# Patient Record
Sex: Female | Born: 1935 | Race: White | Hispanic: No | Smoking: Never smoker
Health system: Southern US, Community
[De-identification: ages and names within clinical notes are randomized; demographics above are authoritative.]

## PROBLEM LIST (undated history)

## (undated) DIAGNOSIS — E8881 Metabolic syndrome: Secondary | ICD-10-CM

## (undated) DIAGNOSIS — M179 Osteoarthritis of knee, unspecified: Secondary | ICD-10-CM

## (undated) DIAGNOSIS — E039 Hypothyroidism, unspecified: Secondary | ICD-10-CM

## (undated) DIAGNOSIS — K219 Gastro-esophageal reflux disease without esophagitis: Secondary | ICD-10-CM

## (undated) DIAGNOSIS — E785 Hyperlipidemia, unspecified: Secondary | ICD-10-CM

## (undated) DIAGNOSIS — F32A Depression, unspecified: Secondary | ICD-10-CM

## (undated) DIAGNOSIS — F419 Anxiety disorder, unspecified: Secondary | ICD-10-CM

## (undated) DIAGNOSIS — D510 Vitamin B12 deficiency anemia due to intrinsic factor deficiency: Secondary | ICD-10-CM

## (undated) DIAGNOSIS — K279 Peptic ulcer, site unspecified, unspecified as acute or chronic, without hemorrhage or perforation: Secondary | ICD-10-CM

## (undated) DIAGNOSIS — M1710 Unilateral primary osteoarthritis, unspecified knee: Secondary | ICD-10-CM

## (undated) DIAGNOSIS — Z471 Aftercare following joint replacement surgery: Secondary | ICD-10-CM

## (undated) DIAGNOSIS — I5030 Unspecified diastolic (congestive) heart failure: Secondary | ICD-10-CM

## (undated) DIAGNOSIS — I10 Essential (primary) hypertension: Secondary | ICD-10-CM

## (undated) DIAGNOSIS — F329 Major depressive disorder, single episode, unspecified: Secondary | ICD-10-CM

## (undated) HISTORY — DX: Peptic ulcer, site unspecified, unspecified as acute or chronic, without hemorrhage or perforation: K27.9

## (undated) HISTORY — DX: Unspecified diastolic (congestive) heart failure: I50.30

## (undated) HISTORY — DX: Hypothyroidism, unspecified: E03.9

## (undated) HISTORY — DX: Metabolic syndrome: E88.810

## (undated) HISTORY — DX: Vitamin B12 deficiency anemia due to intrinsic factor deficiency: D51.0

## (undated) HISTORY — PX: TONSILLECTOMY: SUR1361

## (undated) HISTORY — DX: Metabolic syndrome: E88.81

## (undated) HISTORY — DX: Hyperlipidemia, unspecified: E78.5

## (undated) HISTORY — DX: Unilateral primary osteoarthritis, unspecified knee: M17.10

## (undated) HISTORY — DX: Depression, unspecified: F32.A

## (undated) HISTORY — DX: Anxiety disorder, unspecified: F41.9

## (undated) HISTORY — PX: TUBAL LIGATION: SHX77

## (undated) HISTORY — DX: Osteoarthritis of knee, unspecified: M17.9

## (undated) HISTORY — DX: Aftercare following joint replacement surgery: Z47.1

## (undated) HISTORY — DX: Major depressive disorder, single episode, unspecified: F32.9

## (undated) HISTORY — PX: OTHER SURGICAL HISTORY: SHX169

## (undated) HISTORY — DX: Gastro-esophageal reflux disease without esophagitis: K21.9

## (undated) HISTORY — DX: Essential (primary) hypertension: I10

---

## 1975-01-04 ENCOUNTER — Encounter: Payer: Self-pay | Admitting: Cardiology

## 2008-07-25 ENCOUNTER — Ambulatory Visit: Payer: Self-pay | Admitting: Family Medicine

## 2008-07-25 DIAGNOSIS — F329 Major depressive disorder, single episode, unspecified: Secondary | ICD-10-CM

## 2008-07-25 DIAGNOSIS — R0602 Shortness of breath: Secondary | ICD-10-CM

## 2008-07-25 DIAGNOSIS — F411 Generalized anxiety disorder: Secondary | ICD-10-CM

## 2008-07-25 DIAGNOSIS — R05 Cough: Secondary | ICD-10-CM

## 2008-07-25 LAB — CONVERTED CEMR LAB
Eosinophils Relative: 0 % (ref 0–5)
HCT: 43.7 % (ref 36.0–46.0)
Lymphocytes Relative: 26 % (ref 12–46)
Lymphs Abs: 2.1 10*3/uL (ref 0.7–4.0)
Platelets: 269 10*3/uL (ref 150–400)
WBC: 8 10*3/uL (ref 4.0–10.5)

## 2008-08-02 ENCOUNTER — Ambulatory Visit: Payer: Self-pay | Admitting: Family Medicine

## 2008-08-02 DIAGNOSIS — I10 Essential (primary) hypertension: Secondary | ICD-10-CM

## 2008-08-02 DIAGNOSIS — E039 Hypothyroidism, unspecified: Secondary | ICD-10-CM | POA: Insufficient documentation

## 2008-08-02 DIAGNOSIS — M6281 Muscle weakness (generalized): Secondary | ICD-10-CM

## 2008-08-13 ENCOUNTER — Encounter: Payer: Self-pay | Admitting: Family Medicine

## 2008-09-10 ENCOUNTER — Telehealth (INDEPENDENT_AMBULATORY_CARE_PROVIDER_SITE_OTHER): Payer: Self-pay | Admitting: *Deleted

## 2008-09-11 ENCOUNTER — Encounter: Payer: Self-pay | Admitting: Family Medicine

## 2008-10-16 ENCOUNTER — Encounter: Payer: Self-pay | Admitting: Family Medicine

## 2008-10-18 ENCOUNTER — Ambulatory Visit: Payer: Self-pay | Admitting: Family Medicine

## 2008-10-18 DIAGNOSIS — M545 Low back pain: Secondary | ICD-10-CM

## 2008-10-23 ENCOUNTER — Encounter: Payer: Self-pay | Admitting: Family Medicine

## 2008-12-10 ENCOUNTER — Encounter: Payer: Self-pay | Admitting: Family Medicine

## 2009-01-13 ENCOUNTER — Ambulatory Visit: Payer: Self-pay | Admitting: Family Medicine

## 2009-01-13 DIAGNOSIS — D510 Vitamin B12 deficiency anemia due to intrinsic factor deficiency: Secondary | ICD-10-CM

## 2009-01-17 ENCOUNTER — Ambulatory Visit: Payer: Self-pay | Admitting: Family Medicine

## 2009-01-17 DIAGNOSIS — R7301 Impaired fasting glucose: Secondary | ICD-10-CM | POA: Insufficient documentation

## 2009-01-21 ENCOUNTER — Telehealth: Payer: Self-pay | Admitting: Family Medicine

## 2009-01-23 ENCOUNTER — Telehealth: Payer: Self-pay | Admitting: Family Medicine

## 2009-02-10 ENCOUNTER — Ambulatory Visit: Payer: Self-pay | Admitting: Family Medicine

## 2009-02-11 ENCOUNTER — Ambulatory Visit: Payer: Self-pay | Admitting: Family Medicine

## 2009-02-13 ENCOUNTER — Encounter: Payer: Self-pay | Admitting: Family Medicine

## 2009-02-14 ENCOUNTER — Encounter: Payer: Self-pay | Admitting: Family Medicine

## 2009-02-17 ENCOUNTER — Encounter: Payer: Self-pay | Admitting: Family Medicine

## 2009-03-11 ENCOUNTER — Encounter: Payer: Self-pay | Admitting: Family Medicine

## 2009-03-19 ENCOUNTER — Telehealth: Payer: Self-pay | Admitting: Family Medicine

## 2009-03-25 ENCOUNTER — Encounter: Payer: Self-pay | Admitting: Family Medicine

## 2009-04-01 ENCOUNTER — Ambulatory Visit: Payer: Self-pay | Admitting: Family Medicine

## 2009-04-01 DIAGNOSIS — R5381 Other malaise: Secondary | ICD-10-CM

## 2009-04-01 DIAGNOSIS — R197 Diarrhea, unspecified: Secondary | ICD-10-CM

## 2009-04-01 DIAGNOSIS — R5383 Other fatigue: Secondary | ICD-10-CM

## 2009-04-01 DIAGNOSIS — M810 Age-related osteoporosis without current pathological fracture: Secondary | ICD-10-CM | POA: Insufficient documentation

## 2009-04-03 ENCOUNTER — Ambulatory Visit: Payer: Self-pay | Admitting: Family Medicine

## 2009-04-16 ENCOUNTER — Encounter: Payer: Self-pay | Admitting: Family Medicine

## 2009-04-16 LAB — CONVERTED CEMR LAB
Albumin: 4.1 g/dL (ref 3.5–5.2)
BUN: 10 mg/dL (ref 6–23)
CO2: 21 meq/L (ref 19–32)
Calcium: 9.3 mg/dL (ref 8.4–10.5)
Chloride: 102 meq/L (ref 96–112)
Glucose, Bld: 108 mg/dL — ABNORMAL HIGH (ref 70–99)
Hemoglobin: 14 g/dL (ref 12.0–15.0)
MCHC: 32.4 g/dL (ref 30.0–36.0)
Potassium: 3.8 meq/L (ref 3.5–5.3)
RBC: 4.96 M/uL (ref 3.87–5.11)
TSH: 0.47 microintl units/mL (ref 0.350–4.500)
Vit D, 25-Hydroxy: 42 ng/mL (ref 30–89)
Vitamin B-12: 1170 pg/mL — ABNORMAL HIGH (ref 211–911)

## 2009-04-17 ENCOUNTER — Encounter: Payer: Self-pay | Admitting: Family Medicine

## 2009-05-14 ENCOUNTER — Encounter: Payer: Self-pay | Admitting: Family Medicine

## 2009-05-21 ENCOUNTER — Telehealth: Payer: Self-pay | Admitting: Family Medicine

## 2009-05-22 ENCOUNTER — Ambulatory Visit: Payer: Self-pay | Admitting: Family Medicine

## 2009-05-22 DIAGNOSIS — R809 Proteinuria, unspecified: Secondary | ICD-10-CM

## 2009-05-22 LAB — CONVERTED CEMR LAB
Bacteria, UA: NONE SEEN
Blood in Urine, dipstick: NEGATIVE
Nitrite: NEGATIVE
RBC / HPF: NONE SEEN (ref <3)
Urobilinogen, UA: 2
pH: 6

## 2009-05-23 ENCOUNTER — Encounter: Payer: Self-pay | Admitting: Family Medicine

## 2009-05-23 ENCOUNTER — Encounter (INDEPENDENT_AMBULATORY_CARE_PROVIDER_SITE_OTHER): Payer: Self-pay | Admitting: *Deleted

## 2009-05-26 LAB — CONVERTED CEMR LAB
ALT: 10 units/L (ref 0–35)
AST: 14 units/L (ref 0–37)
Albumin: 4.1 g/dL (ref 3.5–5.2)
Alkaline Phosphatase: 105 units/L (ref 39–117)
Calcium: 9.5 mg/dL (ref 8.4–10.5)
Chloride: 99 meq/L (ref 96–112)
Creatinine, Ser: 1.05 mg/dL (ref 0.40–1.20)
LDL Cholesterol: 166 mg/dL — ABNORMAL HIGH (ref 0–99)
Potassium: 3.6 meq/L (ref 3.5–5.3)
Pro B Natriuretic peptide (BNP): 22.1 pg/mL (ref 0.0–100.0)
Total CHOL/HDL Ratio: 4.8
Total CK: 42 units/L (ref 7–177)
Troponin I: 0.01 ng/mL (ref <0.06)

## 2009-06-03 ENCOUNTER — Ambulatory Visit: Payer: Self-pay | Admitting: Family Medicine

## 2009-06-18 ENCOUNTER — Encounter: Payer: Self-pay | Admitting: Cardiology

## 2009-06-18 ENCOUNTER — Ambulatory Visit: Payer: Self-pay | Admitting: Cardiology

## 2009-07-03 ENCOUNTER — Telehealth (INDEPENDENT_AMBULATORY_CARE_PROVIDER_SITE_OTHER): Payer: Self-pay | Admitting: *Deleted

## 2009-07-04 ENCOUNTER — Encounter: Payer: Self-pay | Admitting: Family Medicine

## 2009-07-07 ENCOUNTER — Encounter (HOSPITAL_COMMUNITY): Admission: RE | Admit: 2009-07-07 | Discharge: 2009-09-16 | Payer: Self-pay | Admitting: Cardiology

## 2009-07-07 ENCOUNTER — Ambulatory Visit: Payer: Self-pay

## 2009-07-07 ENCOUNTER — Encounter: Payer: Self-pay | Admitting: Cardiology

## 2009-07-07 ENCOUNTER — Ambulatory Visit (HOSPITAL_COMMUNITY): Admission: RE | Admit: 2009-07-07 | Discharge: 2009-07-07 | Payer: Self-pay | Admitting: Cardiology

## 2009-07-07 ENCOUNTER — Ambulatory Visit: Payer: Self-pay | Admitting: Cardiovascular Disease

## 2009-07-08 ENCOUNTER — Encounter: Payer: Self-pay | Admitting: Cardiovascular Disease

## 2009-07-14 ENCOUNTER — Encounter: Payer: Self-pay | Admitting: Family Medicine

## 2009-07-15 ENCOUNTER — Encounter: Payer: Self-pay | Admitting: Family Medicine

## 2009-07-18 ENCOUNTER — Encounter: Payer: Self-pay | Admitting: Family Medicine

## 2009-07-28 ENCOUNTER — Telehealth: Payer: Self-pay | Admitting: Family Medicine

## 2009-07-29 ENCOUNTER — Encounter: Payer: Self-pay | Admitting: Family Medicine

## 2009-08-06 ENCOUNTER — Ambulatory Visit: Payer: Self-pay | Admitting: Family Medicine

## 2009-08-20 ENCOUNTER — Encounter: Payer: Self-pay | Admitting: Family Medicine

## 2010-04-28 ENCOUNTER — Telehealth: Payer: Self-pay | Admitting: Family Medicine

## 2010-04-30 ENCOUNTER — Encounter: Payer: Self-pay | Admitting: Family Medicine

## 2010-05-29 ENCOUNTER — Encounter: Payer: Self-pay | Admitting: Family Medicine

## 2010-06-01 ENCOUNTER — Telehealth: Payer: Self-pay | Admitting: Family Medicine

## 2010-06-01 ENCOUNTER — Ambulatory Visit
Admission: RE | Admit: 2010-06-01 | Discharge: 2010-06-01 | Payer: Self-pay | Source: Home / Self Care | Attending: Family Medicine | Admitting: Family Medicine

## 2010-06-01 ENCOUNTER — Encounter: Payer: Self-pay | Admitting: Family Medicine

## 2010-06-01 DIAGNOSIS — I5189 Other ill-defined heart diseases: Secondary | ICD-10-CM | POA: Insufficient documentation

## 2010-06-02 LAB — CONVERTED CEMR LAB
BUN: 13 mg/dL (ref 6–23)
Basophils Relative: 2 % — ABNORMAL HIGH (ref 0–1)
Creatinine, Ser: 1.1 mg/dL (ref 0.40–1.20)
Eosinophils Absolute: 0 10*3/uL (ref 0.0–0.7)
Hemoglobin: 13.8 g/dL (ref 12.0–15.0)
MCHC: 33.7 g/dL (ref 30.0–36.0)
MCV: 85 fL (ref 78.0–100.0)
Monocytes Absolute: 0.5 10*3/uL (ref 0.1–1.0)
Monocytes Relative: 4 % (ref 3–12)
RBC: 4.81 M/uL (ref 3.87–5.11)

## 2010-06-03 ENCOUNTER — Telehealth: Payer: Self-pay | Admitting: Family Medicine

## 2010-06-11 ENCOUNTER — Telehealth: Payer: Self-pay | Admitting: Family Medicine

## 2010-06-15 ENCOUNTER — Ambulatory Visit: Admit: 2010-06-15 | Payer: Self-pay | Admitting: Family Medicine

## 2010-06-16 NOTE — Miscellaneous (Signed)
Summary: Care Plan/[facility]  Care Plan/[facility]   Imported By: Lanelle Bal 05/29/2009 14:26:27  _____________________________________________________________________  External Attachment:    Type:   Image     Comment:   External Document

## 2010-06-16 NOTE — Miscellaneous (Signed)
Summary: PSN Eval Order/[facility]  PSN Eval Order/[facility]   Imported By: Lanelle Bal 07/16/2009 10:15:39  _____________________________________________________________________  External Attachment:    Type:   Image     Comment:   External Document

## 2010-06-16 NOTE — Miscellaneous (Signed)
Summary: PT Discharge/[facility]  PT Discharge/[facility]   Imported By: Lanelle Bal 08/15/2009 13:56:07  _____________________________________________________________________  External Attachment:    Type:   Image     Comment:   External Document

## 2010-06-16 NOTE — Progress Notes (Signed)
Summary: Nuclear Pre-Procedure  Phone Note Outgoing Call Call back at [facility] Phone [phone]   Call placed by: Stanton Kidney, EMT-P,  July 03, 2009 1:56 PM Call placed to: Patient Action Taken: Phone Call Completed Summary of Call: Reviewed information on Myoview Information Sheet (see scanned document for further details).  Spoke with Patient.    Nuclear Med Background Indications for Stress Test: Evaluation for Ischemia, Abnormal EKG     Symptoms: Chest Pain, Diaphoresis, DOE    Nuclear Pre-Procedure Cardiac Risk Factors: Family History - CAD, Hypertension, Lipids Height (in): 66.5

## 2010-06-16 NOTE — Assessment & Plan Note (Signed)
Summary: BP DROPPED Yesterday   Vital Signs:  Patient profile:   77 year old female Height:      66.5 inches Weight:      186 pounds  Vitals Entered By: Kathlene November (May 22, 2009 11:52 AM)   Primary Care Provider:  Nani Gasser MD   History of Present Illness: Has felt fatigued and then yesterday almost passed out when stood up to get a  bottle of pills.  The home health nurse checked her BP and it was 90/60.  Then repeat to 150/80.  Still having occcassional diarrhea.  off teh nexium felt great for about 10 days and diarrhea improved.  Now on ranitidine.  Stilll having some diarrhea but is better than she was.   Drinks 2 cups of coffee adn 2 cups of water.  1 can of soda a day. Also have atypical chest pain but says it really feels like heartburn. No chest pain right now.   CBC and thryoid were normal 4 weeks ago on her labwork.  No blood in the urine or stool.   Allergies: 1)  ! Prednisone 2)  ! Sulfa  Past History:  Past Medical History: Reviewed history from 02/11/2009 and no changes required. Current Problems:  MUSCLE WEAKNESS (GENERALIZED) (ICD-728.87)n- Uses a walker.  HYPOTHYROIDISM (ICD-244.9) BRONCHITIS, ACUTE (ICD-466.0) HYPERTENSION, BENIGN (ICD-401.1) DYSPNEA (ICD-786.05) COUGH (ICD-786.2) FAMILY HISTORY DIABETES 1ST DEGREE RELATIVE (ICD-V18.0) ANXIETY (ICD-300.00) DEPRESSION (ICD-311) HYPERTENSION (ICD-401.9)  Physical Exam  General:  Well-developed,well-nourished,in no acute distress; alert,appropriate and cooperative throughout examination Head:  Normocephalic and atraumatic without obvious abnormalities. No apparent alopecia or balding. Neck:  No deformities, masses, or tenderness noted. Lungs:  Normal respiratory effort, chest expands symmetrically. Lungs are clear to auscultation, no crackles or wheezes. Heart:  Normal rate and regular rhythm. S1 and S2 normal without gallop, murmur, click, rub or other extra sounds. No carotid bruits.     Pulses:  Radial 2+  Extremities:  Trace bilat LE edema.  Skin:  Skin feels diaphoretic Psych:  Cognition and judgment appear intact. Alert and cooperative with normal attention span and concentration. No apparent delusions, illusions, hallucinations   Impression & Recommendations:  Problem # 1:  CHEST PAIN, ATYPICAL (ICD-786.59) Discussed that her EKG is stable but still need to see cardiology. Also with her "heart skipping" she may need  a stress echo and maybe a holter monitor. Did discuss increaseing her fluid intake. With the diarreha I wonder if she may be a little dehydrated. DId have protein, bili, and LE on urine so will send for micro and urine.  If meantime if CP recurs an is more severe or has a syncopal episode needs to go to the ED.   EKG show rate 98 bm. no PVCs. Poor r wave progresssion.  ST depression in Leads V2, V3.  RAE. Also she is insuling resistant so I wonder if a low sugar or high sugar could also be causing some of her symptoms.   Orders: Cardiology Referral (Cardiology) T-Comprehensive Metabolic Panel [phone]) T-Lipid Profile [phone]) T- * Misc. Laboratory test [phone]) T-BNP  (B Natriuretic Peptide) [phone])  Problem # 2:  PROTEINURIA (ICD-791.0)  Orders: T-Urine Culture (Spectrum Order) [phone]) T-Urine Microscopic [phone]) UA Dipstick w/o Micro (automated)  ([id number])  Complete Medication List: 1)  Cymbalta 30 Mg Cpep (Duloxetine hcl) .Marland Kitchen.. 1 tab by mouth once daily 2)  Ranitidine Hcl 150 Mg Caps (Ranitidine hcl) .... Take 1 tablet by mouth two times a day 3)  Alprazolam 1 Mg  Tabs (Alprazolam) .... 1/2 tab by mouth am and 1/2 tab by mouth pm 4)  Armour Thyroid 90 Mg Tabs (Thyroid) .... Take one tablet by mouth daily 5)  Metoclopramide Hcl 5 Mg Tabs (Metoclopramide hcl) .... Take one tablet by mouth in the morning and evening 6)  Hydrochlorothiazide 25 Mg Tabs (Hydrochlorothiazide) .... Take 1/2 tablet by mouth once a day 7)   Amlodipine Besylate 5 Mg Tabs (Amlodipine besylate) .... Take one tablet by mouth ion the morning 8)  Cyanocobalamin 1000 Mcg/ml Soln (Cyanocobalamin) .... Single dose vial for 1 injection 9)  Triamcinolone Acetonide 0.5 % Crea (Triamcinolone acetonide) .... Mixed 1:1 with aquaphor  Laboratory Results   Urine Tests  Date/Time Received: 05/22/2009 Date/Time Reported: 05/22/2009  Routine Urinalysis   Color: straw Appearance: Clear Glucose: negative   (Normal Range: Negative) Bilirubin: moderate   (Normal Range: Negative) Ketone: small (15)   (Normal Range: Negative) Spec. Gravity: 1.020   (Normal Range: 1.003-1.035) Blood: negative   (Normal Range: Negative) pH: 6.0   (Normal Range: 5.0-8.0) Protein: 100   (Normal Range: Negative) Urobilinogen: 2.0   (Normal Range: 0-1) Nitrite: negative   (Normal Range: Negative) Leukocyte Esterace: trace   (Normal Range: Negative)       Appended Document: Near syncope

## 2010-06-16 NOTE — Progress Notes (Signed)
Summary: BP  Phone Note Other Incoming Call back at [phone]   Caller: Drinda Butts w/ [facility] Summary of Call: Nurse calls and was at patient home doing her annual recertification and when pt stood up got real dizzy so took a sitting BP and it was 150/80 and when stood up BP 90/60 with pulse of 91 and regular Initial call taken by: Kathlene November,  May 21, 2009 1:25 PM  Follow-up for Phone Call        Have her schedule an OV> Any signs of dehydration?  Follow-up by: Nani Gasser MD,  May 21, 2009 1:50 PM  Additional Follow-up for Phone Call Additional follow up Details #1::        Pt notified of MD instructions and sent to scheduling to schedule an OV Additional Follow-up by: Kathlene November,  May 21, 2009 2:09 PM

## 2010-06-16 NOTE — Miscellaneous (Signed)
Summary: Care Plan/[facility]  Care Plan/[facility]   Imported By: Lanelle Bal 06/16/2009 08:01:40  _____________________________________________________________________  External Attachment:    Type:   Image     Comment:   External Document

## 2010-06-16 NOTE — Miscellaneous (Signed)
Summary: PT Orders/[facility]  PT Orders/[facility]   Imported By: Lanelle Bal 09/11/2009 09:03:04  _____________________________________________________________________  External Attachment:    Type:   Image     Comment:   External Document

## 2010-06-16 NOTE — Miscellaneous (Signed)
Summary: Missed Visit/[facility]  Missed Visit/[facility]   Imported By: Lanelle Bal 08/14/2009 09:45:08  _____________________________________________________________________  External Attachment:    Type:   Image     Comment:   External Document

## 2010-06-16 NOTE — Miscellaneous (Signed)
Summary: Care Plan/[facility]  Care Plan/[facility]   Imported By: Lanelle Bal 08/05/2009 09:58:36  _____________________________________________________________________  External Attachment:    Type:   Image     Comment:   External Document

## 2010-06-16 NOTE — Progress Notes (Signed)
Summary: Correct form for recert  Phone Note Outgoing Call   Summary of Call: Called [facility] to have them correct meds on recert for home health and then refax to me.  Initial call taken by: Nani Gasser MD,  July 28, 2009 12:07 PM

## 2010-06-16 NOTE — Miscellaneous (Signed)
Summary: PSN Order/[facility]  PSN Order/[facility]   Imported By: Lanelle Bal 07/11/2009 08:03:50  _____________________________________________________________________  External Attachment:    Type:   Image     Comment:   External Document

## 2010-06-16 NOTE — Letter (Signed)
Summary: Primary Care Consult Scheduled Letter  [place] [facility]  [address]   [place], [place] [address]   Phone: [phone]  Fax: [phone]      05/23/2009 MRN: [id number]  Nicole Duffy [address], [address] [place]  [address]    Dear Ms. Marconi,      We have scheduled an appointment for you.  At the recommendation of Dr.Metheney, we have scheduled you a consult with Dr Jens Som, [facility] , Kathryne Sharper  on January 19th  at [place].  Their address is [address] [place] [address] [place]  . The office phone number is [phone].  If this appointment day and time is not convenient for you, please feel free to call the office of the doctor you are being referred to at the number listed above and reschedule the appointment.     It is important for you to keep your scheduled appointments. We are here to make sure you are given good patient care. If you have questions or you have made changes to your appointment, please notify us at  [phone], ask for [facility].    Thank you,  Patient Care Coordinator [place] [facility]

## 2010-06-16 NOTE — Assessment & Plan Note (Signed)
Summary: [facility]   Visit Type:  Follow-up Primary Provider:  Nani Gasser MD  CC:  Abnormal EKG.  History of Present Illness: 77 year old female for evaluation of chest pain and an abnormal electrocardiogram. The patient has no prior cardiac history. She is somewhat immobile due to 2 knee problems. She walks with a walker. Over the past one to 2 years she has noticed dyspnea on exertion or lead with rest. It is not associated with chest pain. There is no orthopnea or PND but has chronic pedal edema. She also occasionally feels a discomfort in her chest that is difficult to describe. It feels as though "she cannot take a deep breath". It is not pleuritic or positional. It does not radiate. She does have some diaphoresis. He can occur either with exertion or at rest. It improves when she tries to "be quiet". She was seen in early January due to transient hypotension. Her electrocardiogram was felt to be abnormal and cardiology was asked to further evaluate. Note cardiac markers were negative x2. Also note a BNP was normal at that time. Because of her chest pain and abnormal electrocardiogram cardiology was asked to further evaluate.   Current Medications (verified): 1)  Ranitidine Hcl 150 Mg Caps (Ranitidine Hcl) .... Take 1 Tablet By Mouth Two Times A Day 2)  Alprazolam 1 Mg Tabs (Alprazolam) .... 1/2 Tab By Mouth Am and 1/2 Tab By Mouth Pm 3)  Armour Thyroid 90 Mg Tabs (Thyroid) .... Take One Tablet By Mouth Daily 4)  Metoclopramide Hcl 5 Mg Tabs (Metoclopramide Hcl) .... Take One Tablet By Mouth in The Morning and Evening 5)  Hydrochlorothiazide 25 Mg Tabs (Hydrochlorothiazide) .... Take 1/2 Tablet By Mouth Once A Day 6)  Amlodipine Besylate 5 Mg Tabs (Amlodipine Besylate) .... Take One Tablet By Mouth Ion The Morning 7)  Triamcinolone Acetonide 0.5 % Crea (Triamcinolone Acetonide) .... Mixed 1:1 With Aquaphor 8)  Simvastatin 20 Mg Tabs (Simvastatin) .... Take 1 Tablet By Mouth  Once A Day At Bedtime 9)  Cymbalta 30 Mg Cpep (Duloxetine Hcl) .... Take 1 Capsule By Mouth Once A Day 10)  B Complex-B12  Tabs (B Complex Vitamins) .... Take 1 Tablet By Mouth Once A Day 11)  Prolent- Vitamin .... Take 1 Tablet By Mouth Once A Day 12)  Lentra .... Take 1 Tablet By Mouth Once A Day 13)  Leg Cramps .... Take 1 Tablet By Mouth Once A Day  Allergies: 1)  ! Prednisone 2)  ! Sulfa  Past History:  Past Medical History: Current Problems:  HYPERTENSION, BENIGN (ICD-401.1) PROTEINURIA (ICD-791.0) OSTEOPOROSIS (ICD-733.00) SINUSITIS - ACUTE-NOS (ICD-461.9) INSULIN RESISTANCE SYNDROME (ICD-259.8) ANEMIA, PERNICIOUS (ICD-281.0) LUMBAGO (ICD-724.2) HYPOTHYROIDISM (ICD-244.9) ANXIETY (ICD-300.00) DEPRESSION (ICD-311) Hyperlipidemia Remote history of peptic ulcer disease GERD  Past Surgical History: Reviewed history from 07/25/2008 and no changes required. Hysterectomy Tubal ligation Tonsillectomy  Family History: Reviewed history from 08/02/2008 and no changes required. Family History of Colon CA 1st degree relative <60 Family History Ovarian cancer Family History Diabetes 1st degree relative, borther Brother w/ depression, HTN, Hi cholesterol  Brother with CABG at age 32  Social History: Reviewed history from 08/02/2008 and no changes required. Divorced.  Completed community college.  Lives in assited living center.   Never Smoked Alcohol use-no Drug use-no Regular exercise-no  Review of Systems       Chronic weakness and dyspnea on exertion but no fevers or chills, productive cough, hemoptysis, dysphasia, odynophagia, melena, hematochezia, dysuria, hematuria, rash, seizure activity, orthopnea, PND,  claudication. Remaining systems are negative.   Vital Signs:  Patient profile:   77 year old female Height:      66.5 inches Weight:      189.25 pounds BMI:     30.20 Pulse rate:   112 / minute Pulse rhythm:   irregular Resp:     18 per minute BP  sitting:   150 / 70  (left arm) Cuff size:   large  Vitals Entered By: Vikki Ports (June 18, 2009 2:17 PM)  Physical Exam  General:  Well developed/well nourished in NAD Skin warm/dry Patient not depressed No peripheral clubbing Back-normal HEENT-normal/normal eyelids Neck supple/normal carotid upstroke bilaterally; no bruits; no JVD; no thyromegaly chest - CTA/ normal expansion CV - RRR/normal S1 and S2; no murmurs, rubs or gallops;  PMI nondisplaced; 1/6 systolic ejection murmur. Abdomen -NT/ND, no HSM, no mass, + bowel sounds, no bruit 2+ femoral pulses, no bruits Ext-trace edema, chords, 2+ DP Neuro-grossly nonfocal     EKG  Procedure date:  06/18/2009  Findings:      Sinus rhythm rate of 90. Left anterior fascicular block. Cannot rule out prior septal infarct.  Impression & Recommendations:  Problem # 1:  CHEST PAIN, ATYPICAL (ICD-786.59) Symptoms atypical. I will schedule a Lexiscan Myoview for risk stratification. Her updated medication list for this problem includes:    Amlodipine Besylate 5 Mg Tabs (Amlodipine besylate) .Marland Kitchen... Take one tablet by mouth ion the morning  Orders: Nuclear Stress Test (Nuc Stress Test)  Problem # 2:  DYSPNEA (ICD-786.05) Her BNP was normal and she does not appear to be volume overloaded. I wonder if there may be a component of deconditioning related to her inability to exercise and decreased mobility. I will schedule an echocardiogram to quantify LV function. Her murmur sounds to be an ejection murmur. Her updated medication list for this problem includes:    Hydrochlorothiazide 25 Mg Tabs (Hydrochlorothiazide) .Marland Kitchen... Take 1/2 tablet by mouth once a day    Amlodipine Besylate 5 Mg Tabs (Amlodipine besylate) .Marland Kitchen... Take one tablet by mouth ion the morning  Problem # 3:  HYPERTENSION, BENIGN (ICD-401.1)  Blood pressure mildly elevated. This will be followed and her Norvasc can be increased as needed. Her updated medication list  for this problem includes:    Hydrochlorothiazide 25 Mg Tabs (Hydrochlorothiazide) .Marland Kitchen... Take 1/2 tablet by mouth once a day    Amlodipine Besylate 5 Mg Tabs (Amlodipine besylate) .Marland Kitchen... Take one tablet by mouth ion the morning  Orders: Echocardiogram (Echo)  Problem # 4:  HYPOTHYROIDISM (ICD-244.9)  Her updated medication list for this problem includes:    Armour Thyroid 90 Mg Tabs (Thyroid) .Marland Kitchen... Take one tablet by mouth daily  Patient Instructions: 1)  Your physician recommends that you schedule a follow-up appointment in: AS NEEDED PENDING TEST RESULTS 2)  Your physician has requested that you have an LEXISCAN stress myoview.  For further information please visit [email or web address]  Please follow instruction sheet, as given. 3)  Your physician has requested that you have an echocardiogram.  Echocardiography is a painless test that uses sound waves to create images of your heart. It provides your doctor with information about the size and shape of your heart and how well your heart's chambers and valves are working.  This procedure takes approximately one hour. There are no restrictions for this procedure.

## 2010-06-16 NOTE — Miscellaneous (Signed)
Summary: Care Plan/[facility]  Care Plan/[facility]   Imported By: Lanelle Bal 08/12/2009 08:34:08  _____________________________________________________________________  External Attachment:    Type:   Image     Comment:   External Document

## 2010-06-16 NOTE — Assessment & Plan Note (Signed)
Summary: Cardiology Nuclear Study  Nuclear Med Background Indications for Stress Test: Evaluation for Ischemia, Abnormal EKG     Symptoms: Chest Pain, Diaphoresis, DOE    Nuclear Pre-Procedure Cardiac Risk Factors: Family History - CAD, Hypertension, Lipids Caffeine/Decaff Intake: none NPO After: 7:00 PM Lungs: clear IV 0.9% NS with Angio Cath: 20g     IV Site: (R) AC IV Started by: Stanton Kidney EMT-P Chest Size (in) 42     Cup Size D     Height (in): 66.5 Weight (lb): 185 BMI: 29.52  Nuclear Med Study 1 or 2 day study:  1 day     Stress Test Type:  Eugenie Birks Reading MD:  Charlton Haws, MD     Referring MD:  B.Crenshaw Resting Radionuclide:  Technetium 27m Tetrofosmin     Resting Radionuclide Dose:  11.0 mCi  Stress Radionuclide:  Technetium 34m Tetrofosmin     Stress Radionuclide Dose:  33.0 mCi   Stress Protocol   Lexiscan: 0.4 mg   Stress Test Technologist:  Milana Na EMT-P     Nuclear Technologist:  Burna Mortimer Deal RT-N  Rest Procedure  Myocardial perfusion imaging was performed at rest 45 minutes following the intravenous administration of Myoview Technetium 58m Tetrofosmin.  Stress Procedure  The patient received IV Lexiscan 0.4 mg over 15-seconds.  Myoview injected at 30-seconds.  There were no significant changes, + chest tightness, and rare pvcs/fusion beats with infusion.  Quantitative spect images were obtained after a 45 minute delay.  QPS Raw Data Images:  Normal; no motion artifact; normal heart/lung ratio. Stress Images:  NI: Uniform and normal uptake of tracer in all myocardial segments. Rest Images:  Normal homogeneous uptake in all areas of the myocardium. Subtraction (SDS):  Normal Transient Ischemic Dilatation:  n  (Normal <1.22)  Lung/Heart Ratio:  .23  (Normal <0.45)  Quantitative Gated Spect Images QGS EDV:  45 ml QGS ESV:  7 ml QGS EF:  83 % QGS cine images:  normal  Findings Normal nuclear study      Overall Impression  Exercise  Capacity: Lexiscan BP Response: Normal blood pressure response. Clinical Symptoms: chest tightness ECG Impression: No significant ST segment change suggestive of ischemia. Overall Impression: Normal stress nuclear study.  Appended Document: Cardiology Nuclear Study ok  Appended Document: Cardiology Nuclear Study pt aware of results

## 2010-06-18 ENCOUNTER — Encounter: Payer: Self-pay | Admitting: Family Medicine

## 2010-06-18 ENCOUNTER — Ambulatory Visit (INDEPENDENT_AMBULATORY_CARE_PROVIDER_SITE_OTHER): Payer: 59 | Admitting: Family Medicine

## 2010-06-18 DIAGNOSIS — I10 Essential (primary) hypertension: Secondary | ICD-10-CM

## 2010-06-18 DIAGNOSIS — R05 Cough: Secondary | ICD-10-CM

## 2010-06-18 DIAGNOSIS — R059 Cough, unspecified: Secondary | ICD-10-CM

## 2010-06-18 NOTE — Progress Notes (Signed)
Summary: Change med sig  Phone Note From Pharmacy   Summary of Call: for next xanax refill change to 0.5 mg so doesn' thave to cut inhalf. Went ahead and changed sig under the med list.  Initial call taken by: Nani Gasser MD,  June 03, 2010 11:57 AM    New/Updated Medications: ALPRAZOLAM 0.5 MG TABS (ALPRAZOLAM) one by mouth up to three times a day as needed anxiety 60/mo  Appended Document: Change med sig

## 2010-06-18 NOTE — Letter (Signed)
Summary: [facility]  [facility]   Imported By: Lanelle Bal 05/25/2010 12:48:25  _____________________________________________________________________  External Attachment:    Type:   Image     Comment:   External Document

## 2010-06-18 NOTE — Progress Notes (Signed)
Summary: Home Health Orders  Phone Note From Other Clinic Call back at office number [phone]   Caller: Nurse, [facility] Reason for Call: Need Referral Information, Medication Check Summary of Call: Marylene Land did home visit to pt this weekend and had questions regarding her care. Meds verified from pt encounter this AM in office.  Also, nurse would like order for pt to have HomeHealth Aide for the next few weeks as she regains her strength.  Also would like order to add Cardiac Program to her care so they can monitor her BP as she was having orthostatic hypertension issues at the [facility].  Please advise and I can call verbal order to [place] today. [phone]) Initial call taken by: Francee Piccolo CMA Duncan Dull),  June 01, 2010 2:41 PM  Follow-up for Phone Call        Hold off on cardiac program for now. OK for home health aide.  Follow-up by: Nani Gasser MD,  June 01, 2010 2:57 PM  Additional Follow-up for Phone Call Additional follow up Details #1::        mesage left for Marylene Land to return call regarding orders. Francee Piccolo CMA Duncan Dull)  June 01, 2010 3:19 PM  Marylene Land notified of orders. Additional Follow-up by: Francee Piccolo CMA (AAMA),  June 01, 2010 3:21 PM

## 2010-06-18 NOTE — Letter (Signed)
Summary: [facility]  [facility]   Imported By: Lanelle Bal 05/21/2010 12:19:19  _____________________________________________________________________  External Attachment:    Type:   Image     Comment:   External Document

## 2010-06-18 NOTE — Progress Notes (Signed)
Summary: FYI- in hospital  Phone Note Call from Patient   Caller: Son Call For: Nani Gasser MD Summary of Call: Son calls and states she is in [place] hospital syncopal episode and low blood pressure. She is in [facility] hospital.  FYI Initial call taken by: Kathlene November LPN,  April 28, 2010 11:39 AM

## 2010-06-18 NOTE — Assessment & Plan Note (Signed)
Summary: Hospital Followup   Vital Signs:  Patient profile:   77 year old female Height:      66.5 inches Weight:      185 pounds Pulse rate:   78 / minute BP sitting:   153 / 76  (right arm) Cuff size:   large  Vitals Entered By: Avon Gully CMA, Duncan Dull) (June 01, 2010 9:24 AM)  Primary Care Provider:  Nani Gasser MD  CC:  F/u hospital  visit.  History of Present Illness: Pt was hospitalized for a hypotensive episode where she passed out and his her head. Did have a laceration.  She was then d/c to a SNIF.  Still feeling very weak since has been home.  D/C home the skilled nursing last Friday. Some trouble with her memory for the last several weeks.  Says she feels she is having a hardtime understanding some things. .  Still coughing some. OUt of her rx of rher reflux.  Has home health coming in for now at home.  Working on her strength. Would like a refill on her cough meds. Has been taking every 6 hours today. Would like a refill on this. WAsn't given a rx for this at d/c so hasn'ty had anything for 4 nights and the cough is keeping her awake.  She still has 5 more days of Levaquin.  Toelrating it well.  her son is with her today, but she does live alone.  Current Medications (verified): 1)  Alprazolam 1 Mg Tabs (Alprazolam) .... 1/2 Tab By Mouth Am and 1/2 Tab By Mouth Pm 2)  Armour Thyroid 90 Mg Tabs (Thyroid) .... Take One Tablet By Mouth Daily 3)  Metoclopramide Hcl 5 Mg Tabs (Metoclopramide Hcl) .... Take One Tablet By Mouth in The Morning and Evening 4)  Amlodipine Besylate 5 Mg Tabs (Amlodipine Besylate) .... Take One Tablet By Mouth Ion The Morning 5)  Simvastatin 20 Mg Tabs (Simvastatin) .... Take 1 Tablet By Mouth Once A Day At Bedtime 6)  Cymbalta 30 Mg Cpep (Duloxetine Hcl) .... Take 1 Capsule By Mouth Once A Day 7)  B Complex-B12  Tabs (B Complex Vitamins) .... Take 1 Tablet By Mouth Once A Day 8)  Prolent- Vitamin .... Take 1 Tablet By Mouth Once A Day 9)   Lentra .... Take 1 Tablet By Mouth Once A Day 10)  Leg Cramps .... Take 1 Tablet By Mouth Once A Day 11)  Pantoprazole Sodium 20 Mg Tbec (Pantoprazole Sodium)  Allergies (verified): 1)  ! Prednisone 2)  ! Sulfa  Comments:  Nurse/Medical Assistant: The patient's medications and allergies were reviewed with the patient and were updated in the Medication and Allergy Lists. Avon Gully CMA, Duncan Dull) (June 01, 2010 9:27 AM)  Past History:  Past Medical History: Remote history of peptic ulcer disease  Social History: Reviewed history from 08/02/2008 and no changes required. Divorced.  Completed community college.  Lives in assited living center.   Never Smoked Alcohol use-no Drug use-no Regular exercise-no  Physical Exam  General:  Well-developed,well-nourished,in no acute distress; alert,appropriate and cooperative throughout examination Head:  Normocephalic and atraumatic without obvious abnormalities. No apparent alopecia or balding. Eyes:  No corneal or conjunctival inflammation noted. EOMI. Perrla.  Ears:  External ear exam shows no significant lesions or deformities.  Otoscopic examination reveals clear canals, tympanic membranes are intact bilaterally without bulging, retraction, inflammation or discharge. Hearing is grossly normal bilaterally. Nose:  External nasal examination shows no deformity or inflammation.  Mouth:  Oral  mucosa and oropharynx without lesions or exudates. Neck:  No deformities, masses, or tenderness noted. Lungs:  normal respiratory effort.  Caorse BS at the apices bilaterally.  Heart:  Normal rate and regular rhythm. S1 and S2 normal without gallop, murmur, click, rub or other extra sounds. Skin:  no rashes.   Psych:  Cognition and judgment appear intact. Alert and cooperative with normal attention span and concentration. No apparent delusions, illusions, hallucinations   Impression & Recommendations:  Problem # 1:  HYPERTENSION, BENIGN  (ICD-401.1) Elevated today but she has not filled the amlodipine for teh higher dose yet. Get this filled and then f/u in 2 weeks. Will recheck BMP off of the HCTZ.   I did ask her to get a home BP cuff and check it herself once a day to start.  The following medications were removed from the medication list:    Hydrochlorothiazide 25 Mg Tabs (Hydrochlorothiazide) .Marland Kitchen... Take 1/2 tablet by mouth once a day Her updated medication list for this problem includes:    Amlodipine Besylate 10 Mg Tabs (Amlodipine besylate) .Marland Kitchen... Take 1 tablet by mouth once a day  Orders: T-Basic Metabolic Panel [phone]) T-CBC w/Diff [phone])  Problem # 2:  MUSCLE WEAKNESS (GENERALIZED) (ICD-728.87) Expalined that I really want to wean her cough med to bedtime only as the cough med can be sedating since has a  narcotic in it and may affect her feeling of weakness etc. Hopefully home PT will be able to help her. She was also put on Levaquin while in the [facility] but I am not sure why, I suspect for PNA. Asked her to complete her ABX and we will recheck her ulngs in 2 weeks.   Problem # 3:  DIASTOLIC DYSFUNCTION (ICD-429.9) Per rec on her D/C summary will refer her to cardiology for further recommendations or w/u.  Orders: Cardiology Referral (Cardiology)  Complete Medication List: 1)  Alprazolam 1 Mg Tabs (Alprazolam) .... 1/2 tab by mouth am and 1/2 tab by mouth pm 2)  Armour Thyroid 90 Mg Tabs (Thyroid) 3)  Metoclopramide Hcl 5 Mg Tabs (Metoclopramide hcl) .... Take one tablet by mouth in the morning and evening 4)  Amlodipine Besylate 10 Mg Tabs (Amlodipine besylate) .... Take 1 tablet by mouth once a day 5)  Simvastatin 20 Mg Tabs (Simvastatin) .... Take 1 tablet by mouth once a day at bedtime 6)  Cymbalta 30 Mg Cpep (Duloxetine hcl) .... Take 1 capsule by mouth once a day 7)  B Complex-b12 Tabs (B complex vitamins) .... Take 1 tablet by mouth once a day 8)  Prolent- Vitamin  .... Take 1 tablet by mouth  once a day 9)  Lentra  .... Take 1 tablet by mouth once a day 10)  Leg Cramps  .... Take 1 tablet by mouth once a day 11)  Pantoprazole Sodium 20 Mg Tbec (Pantoprazole sodium) .... Take 1 tablet by mouth once a day 12)  Hydrocodone-homatropine 5-1.5 Mg/72ml Syrp (Hydrocodone-homatropine) .... 5ml by mouth at bedtime  Patient Instructions: 1)  Please schedule a follow-up appointment in 2 weeks to recheck your lungs.  2)  we will call you with your lab results.  Prescriptions: PANTOPRAZOLE SODIUM 20 MG TBEC (PANTOPRAZOLE SODIUM) Take 1 tablet by mouth once a day  #90 x 1   Entered and Authorized by:   Nani Gasser MD   Signed by:   Nani Gasser MD on 06/01/2010   Method used:   Printed then faxed to [facility] (mail-order)             , [place]         Ph:        Fax: [phone]   RxID:   [id number] CYMBALTA 30 MG CPEP (DULOXETINE HCL) Take 1 capsule by mouth once a day  #90 x 1   Entered and Authorized by:   Nani Gasser MD   Signed by:   Nani Gasser MD on 06/01/2010   Method used:   Printed then faxed to ...       [facility] (mail-order)             , [place]         Ph:        Fax: [phone]   RxID:   [id number] SIMVASTATIN 20 MG TABS (SIMVASTATIN) Take 1 tablet by mouth once a day at bedtime  #90 x 1   Entered and Authorized by:   Nani Gasser MD   Signed by:   Nani Gasser MD on 06/01/2010   Method used:   Printed then faxed to ...       [facility] (mail-order)             , [place]         Ph:        Fax: [phone]   RxID:   [id number] AMLODIPINE BESYLATE 10 MG TABS (AMLODIPINE BESYLATE) Take 1 tablet by mouth once a day  #90 x 1   Entered and Authorized by:   Nani Gasser MD   Signed by:   Nani Gasser MD on 06/01/2010   Method used:   Printed then faxed to ...       right source (mail-order)             , [place]         Ph:        Fax: [phone]   RxID:   [id number] METOCLOPRAMIDE HCL 5 MG  TABS (METOCLOPRAMIDE HCL) Take one tablet by mouth in the morning and evening  #180 x 1   Entered and Authorized by:   Nani Gasser MD   Signed by:   Nani Gasser MD on 06/01/2010   Method used:   Printed then faxed to ...       [facility] source (mail-order)             , [place]         Ph:        Fax: [phone]   RxID:   [id number] ALPRAZOLAM 1 MG TABS (ALPRAZOLAM) 1/2 tab by mouth am and 1/2 tab by mouth pm  #90 x 1   Entered and Authorized by:   Nani Gasser MD   Signed by:   Nani Gasser MD on 06/01/2010   Method used:   Printed then faxed to ...       [facility] source (mail-order)             , [place]         Ph:        Fax: [phone]   RxID:   [id number] ALPRAZOLAM 1 MG TABS (ALPRAZOLAM) 1/2 tab by mouth am and 1/2 tab by mouth pm  #30 x 0   Entered and Authorized by:   Nani Gasser MD   Signed by:   Nani Gasser MD on 06/01/2010   Method used:   Printed then faxed to ...       [facility] (retail)  [address]       [place], [place]  [address]       Ph: [phone]       Fax: [phone]   RxID:   [id number] PANTOPRAZOLE SODIUM 20 MG TBEC (PANTOPRAZOLE SODIUM) Take 1 tablet by mouth once a day  #30 x 0   Entered and Authorized by:   Nani Gasser MD   Signed by:   Nani Gasser MD on 06/01/2010   Method used:   Electronically to        [facility]* (retail)       [address]       [place], [place]  [address]       Ph: [phone]       Fax: [phone]   RxID:   [id number] METOCLOPRAMIDE HCL 5 MG TABS (METOCLOPRAMIDE HCL) Take one tablet by mouth in the morning and evening  #60 x 0   Entered and Authorized by:   Nani Gasser MD   Signed by:   Nani Gasser MD on 06/01/2010   Method used:   Electronically to        [facility]* (retail)       [address]       [place], [place]  [address]       Ph: [phone]       Fax: [phone]   RxID:   [id number] SIMVASTATIN 20 MG TABS (SIMVASTATIN) Take 1  tablet by mouth once a day at bedtime  #30 Each x 0   Entered and Authorized by:   Nani Gasser MD   Signed by:   Nani Gasser MD on 06/01/2010   Method used:   Electronically to        [facility] (retail)       [address]       [place], [place]  [address]       Ph: [phone]       Fax: [phone]   RxID:   [id number] CYMBALTA 30 MG CPEP (DULOXETINE HCL) Take 1 capsule by mouth once a day  #30 x 0   Entered and Authorized by:   Nani Gasser MD   Signed by:   Nani Gasser MD on 06/01/2010   Method used:   Electronically to        [facility] (retail)       [address]       [place], [place]  [address]       Ph: [phone]       Fax: [phone]   RxID:   [id number] AMLODIPINE BESYLATE 10 MG TABS (AMLODIPINE BESYLATE) Take 1 tablet by mouth once a day  #30 x 0   Entered and Authorized by:   Nani Gasser MD   Signed by:   Nani Gasser MD on 06/01/2010   Method used:   Electronically to        [facility]* (retail)       [address]       [place], [place]  [address]       Ph: [phone]       Fax: [phone]   RxID:   [id number] HYDROCODONE-HOMATROPINE 5-1.5 MG/5ML SYRP (HYDROCODONE-HOMATROPINE) 5ml by mouth at bedtime  #120 ml x 0   Entered and Authorized by:   Nani Gasser MD   Signed by:   Nani Gasser MD on 06/01/2010   Method used:   Printed then faxed to ...       [facility] education [facility])             ,  [place]         Ph:        Fax: [phone]   RxID:   [id number]    Orders Added: 1)  T-Basic Metabolic Panel [[address]84[address]] 2)  Cardiology Referral [Cardiology] 3)  T-CBC w/Diff [[id number]] 4)  Est. Patient Level IV [[address]]    Current Medications (verified): 1)  Alprazolam 1 Mg Tabs (Alprazolam) .... 1/2 Tab By Mouth Am and 1/2 Tab By Mouth Pm 2)  Armour Thyroid 90 Mg Tabs (Thyroid) .... Take One Tablet By Mouth Daily 3)  Metoclopramide Hcl 5 Mg Tabs (Metoclopramide Hcl) .... Take One Tablet By Mouth  in The Morning and Evening 4)  Amlodipine Besylate 5 Mg Tabs (Amlodipine Besylate) .... Take One Tablet By Mouth Ion The Morning 5)  Simvastatin 20 Mg Tabs (Simvastatin) .... Take 1 Tablet By Mouth Once A Day At Bedtime 6)  Cymbalta 30 Mg Cpep (Duloxetine Hcl) .... Take 1 Capsule By Mouth Once A Day 7)  B Complex-B12  Tabs (B Complex Vitamins) .... Take 1 Tablet By Mouth Once A Day 8)  Prolent- Vitamin .... Take 1 Tablet By Mouth Once A Day 9)  Lentra .... Take 1 Tablet By Mouth Once A Day 10)  Leg Cramps .... Take 1 Tablet By Mouth Once A Day 11)  Pantoprazole Sodium 20 Mg Tbec (Pantoprazole Sodium)  Allergies (verified): 1)  ! Prednisone 2)  ! Sulfa  Comments:  Nurse/Medical Assistant: The patient's medications and allergies were reviewed with the patient and were updated in the Medication and Allergy Lists. Avon Gully CMA, Duncan Dull) (June 01, 2010 9:27 AM)  CC: F/u hospital  visit  Prescriptions: PANTOPRAZOLE SODIUM 20 MG TBEC (PANTOPRAZOLE SODIUM) Take 1 tablet by mouth once a day  #90 x 1   Entered and Authorized by:   Nani Gasser MD   Signed by:   Nani Gasser MD on 06/01/2010   Method used:   Printed then faxed to ...       [facility] (mail-order)             , [place]         Ph:        Fax: [phone]   RxID:   [id number] CYMBALTA 30 MG CPEP (DULOXETINE HCL) Take 1 capsule by mouth once a day  #90 x 1   Entered and Authorized by:   Nani Gasser MD   Signed by:   Nani Gasser MD on 06/01/2010   Method used:   Printed then faxed to ...       [facility] (mail[facility]order)             , [place]         Ph:        Fax: [phone]   RxID:   [id number] SIMVASTATIN 20 MG TABS (SIMVASTATIN) Take 1 tablet by mouth once a day at bedtime  #90 x 1   Entered and Authorized by:   Nani Gasser MD   Signed by:   Nani Gasser MD on 06/01/2010   Method used:   Printed then faxed to ...       [facility] (mail-order)             , [place]           Ph:        Fax: [phone]   RxID:   [id number] AMLODIPINE BESYLATE 10 MG TABS (AMLODIPINE BESYLATE) Take 1 tablet by mouth once a day  #90 x  1   Entered and Authorized by:   Nani Gasser MD   Signed by:   Nani Gasser MD on 06/01/2010   Method used:   Printed then faxed to ...       [facility] (mail-order)             , [place]         Ph:        Fax: [phone]   RxID:   [id number] METOCLOPRAMIDE HCL 5 MG TABS (METOCLOPRAMIDE HCL) Take one tablet by mouth in the morning and evening  #180 x 1   Entered and Authorized by:   Nani Gasser MD   Signed by:   Nani Gasser MD on 06/01/2010   Method used:   Printed then faxed to ...       [facility] (mail-order)             , [place]         Ph:        Fax: [phone]   RxID:   [id number] ALPRAZOLAM 1 MG TABS (ALPRAZOLAM) 1/2 tab by mouth am and 1/2 tab by mouth pm  #90 x 1   Entered and Authorized by:   Nani Gasser MD   Signed by:   Nani Gasser MD on 06/01/2010   Method used:   Printed then faxed to ...       [facility] (mail-order)             , [place]         Ph:        Fax: [phone]   RxID:   [id number] ALPRAZOLAM 1 MG TABS (ALPRAZOLAM) 1/2 tab by mouth am and 1/2 tab by mouth pm  #30 x 0   Entered and Authorized by:   Nani Gasser MD   Signed by:   Nani Gasser MD on 06/01/2010   Method used:   Printed then faxed to ...       [facility] (retail)       [address]       [place], [place]  [address]       Ph: [phone]       Fax: [phone]   RxID:   [id number] PANTOPRAZOLE SODIUM 20 MG TBEC (PANTOPRAZOLE SODIUM) Take 1 tablet by mouth once a day  #30 x 0   Entered and Authorized by:   Nani Gasser MD   Signed by:   Nani Gasser MD on 06/01/2010   Method used:   Electronically to        [facility]* (retail)       [address]       [place], [place]  [address]       Ph: [phone]       Fax: [phone]   RxID:    [id number] METOCLOPRAMIDE HCL 5 MG TABS (METOCLOPRAMIDE HCL) Take one tablet by mouth in the morning and evening  #60 x 0   Entered and Authorized by:   Nani Gasser MD   Signed by:   Nani Gasser MD on 06/01/2010   Method used:   Electronically to        [facility]* (retail)       [address]       [place], [place]  [address]       Ph: [phone]       Fax: [phone]   RxID:   [id number] SIMVASTATIN 20 MG TABS (SIMVASTATIN) Take 1 tablet by mouth once a day at bedtime  #  30 Each x 0   Entered and Authorized by:   Nani Gasser MD   Signed by:   Nani Gasser MD on 06/01/2010   Method used:   Electronically to        [facility] (retail)       [address]       [place], [place]  [address]       Ph: [phone]       Fax: [phone]   RxID:   [id number] CYMBALTA 30 MG CPEP (DULOXETINE HCL) Take 1 capsule by mouth once a day  #30 x 0   Entered and Authorized by:   Nani Gasser MD   Signed by:   Nani Gasser MD on 06/01/2010   Method used:   Electronically to        [facility]* (retail)       [address]       [place], [place]  [address]       Ph: [phone]       Fax: [phone]   RxID:   [id number] AMLODIPINE BESYLATE 10 MG TABS (AMLODIPINE BESYLATE) Take 1 tablet by mouth once a day  #30 x 0   Entered and Authorized by:   Nani Gasser MD   Signed by:   Nani Gasser MD on 06/01/2010   Method used:   Electronically to        [facility]* (retail)       [address]       [place], [place]  [address]       Ph: [phone]       Fax: [phone]   RxID:   [id number] HYDROCODONE-HOMATROPINE 5-1.5 MG/5ML SYRP (HYDROCODONE-HOMATROPINE) 5ml by mouth at bedtime  #120 ml x 0   Entered and Authorized by:   Nani Gasser MD   Signed by:   Nani Gasser MD on 06/01/2010   Method used:   Printed then faxed to ...       [facility] ([facility]order)             , [place]         Ph:        Fax: [phone]   RxID:    [id number]

## 2010-06-18 NOTE — Progress Notes (Signed)
Summary: Occupational Therapy Orders  Phone Note From Other Clinic Call back at [phone]   Caller: Re Tu-Occupational Therapist, Genevieve Norlander Summary of Call: VM left at 10:05am Ms. Tu would like clarification on occupational therapy orders for this patient.  Home health aide was added on 1/16.  Please advise if additional OT orders are needed.   Initial call taken by: Francee Piccolo CMA Duncan Dull),  June 03, 2010 11:19 AM  Follow-up for Phone Call        Did they send over order for me to sign? If not then can resend it and I will approve it.  Follow-up by: Nani Gasser MD,  June 03, 2010 11:37 AM  Additional Follow-up for Phone Call Additional follow up Details #1::        Home health would like to begin OT twice weekly for four weeks.  Re Tu said verbal order is OK for now and they would be faxing confirmation to the office later. Additional Follow-up by: Francee Piccolo CMA Duncan Dull),  June 03, 2010 12:01 PM    Additional Follow-up for Phone Call Additional follow up Details #2::    Ok for verbal order.  Follow-up by: Nani Gasser MD,  June 03, 2010 1:20 PM   Appended Document: Occupational Therapy Orders Re Tu notified.

## 2010-06-18 NOTE — Progress Notes (Signed)
Summary: New Supplements  Phone Note From Other Clinic   Caller: [facility]--[phone] Reason for Call: Medication Check Summary of Call: French Ana wanted to let you know of two new supplements pt is taking.  Melantonin and OC Max (has detox component, purchased OTC at [facility]).  I have added these to her medlist. Initial call taken by: Francee Piccolo CMA Duncan Dull),  June 11, 2010 10:26 AM    New/Updated Medications: MELATONIN 2.5 MG CAPS (MELATONIN) take 2 tab by mouth at bedtime * OC MAX take 1 tab by mouth once daily

## 2010-06-23 ENCOUNTER — Telehealth: Payer: Self-pay | Admitting: Family Medicine

## 2010-06-24 NOTE — Assessment & Plan Note (Signed)
Summary: F/U HTN, PNA   Vital Signs:  Patient profile:   77 year old female Height:      66.5 inches Weight:      186 pounds Pulse rate:   84 / minute BP sitting:   129 / 67  (right arm) Cuff size:   large  Vitals Entered By: Avon Gully CMA, Duncan Dull) (June 18, 2010 10:12 AM) CC: f/u HTN   Primary Care Provider:  Nani Gasser MD  CC:  f/u HTN.  History of Present Illness: [facility] nurse came out yesterday adn BP was elevated in teh SBP 170s. Has been more forgetful.   Feels her mood is ok. Therapist told her o use her walker. Still feels very tired. Trying to fix breakfast is fatiguing.  Her husband is with hr today. she is having difficulty organizing her meds so the [facility] nurse has been doing this. She says hasn't even read her bible since she has been home from the hospital because of difficulty of focusing. She is still havingn to use her walker and this has been stressful for her. She notices after acitivity her BP is elevated and heart rate is up.  No CP. Says her breathing has been better overall.   She is also having some left low back pain. Started several weeks ago. Notices ainful and worse when flexes to put on her socks adn shoes.  She wants to know if OK to see a chiropracter.   Current Medications (verified): 1)  Alprazolam 0.5 Mg Tabs (Alprazolam) .... One By Mouth Up To Three Times A Day As Needed Anxiety 60/mo 2)  Armour Thyroid 90 Mg Tabs (Thyroid) .... Take 1 Tablet By Mouth Once A Day 3)  Metoclopramide Hcl 5 Mg Tabs (Metoclopramide Hcl) .... Take One Tablet By Mouth in The Morning and Evening 4)  Amlodipine Besylate 10 Mg Tabs (Amlodipine Besylate) .... Take 1 Tablet By Mouth Once A Day 5)  Simvastatin 20 Mg Tabs (Simvastatin) .... Take 1 Tablet By Mouth Once A Day At Bedtime 6)  Cymbalta 30 Mg Cpep (Duloxetine Hcl) .... Take 1 Capsule By Mouth Once A Day 7)  B Complex-B12  Tabs (B Complex Vitamins) .... Take 1 Tablet By Mouth Once A Day 8)   Prolent- Vitamin .... Take 1 Tablet By Mouth Once A Day 9)  Lentra .... Take 1 Tablet By Mouth Once A Day 10)  Leg Cramps .... Take 1 Tablet By Mouth Once A Day 11)  Pantoprazole Sodium 20 Mg Tbec (Pantoprazole Sodium) .... Take 1 Tablet By Mouth Once A Day 12)  Hydrocodone-Homatropine 5-1.5 Mg/74ml Syrp (Hydrocodone-Homatropine) .... 5ml By Mouth At Bedtime 13)  Melatonin 2.5 Mg Caps (Melatonin) .... Take 2 Tab By Mouth At Bedtime 14)  Oc Max .... Take 1 Tab By Mouth Once Daily  Allergies (verified): 1)  ! Prednisone 2)  ! Sulfa  Comments:  Nurse/Medical Assistant: The patient's medications and allergies were reviewed with the patient and were updated in the Medication and Allergy Lists. Avon Gully CMA, Duncan Dull) (June 18, 2010 10:12 AM)  Physical Exam  General:  Well-developed,well-nourished,in no acute distress; alert,appropriate and cooperative throughout examination Head:  Normocephalic and atraumatic without obvious abnormalities. No apparent alopecia or balding. Neck:  No deformities, masses, or tenderness noted. Lungs:  Normal respiratory effort, chest expands symmetrically. Lungs are clear to auscultation, no crackles or wheezes. Heart:  Normal rate and regular rhythm. S1 and S2 normal without gallop, murmur, click, rub or other extra  sounds. Msk:  She is tender over the right SI joints.   Skin:  no rashes.   Psych:  Cognition and judgment appear intact. Alert and cooperative with normal attention span and concentration. No apparent delusions, illusions, hallucinations   Impression & Recommendations:  Problem # 1:  HYPERTENSION, BENIGN (ICD-401.1) Her BP looks great today. I suspect her BP  elevations are from her de-conditioning.   I will see if homehealth can send me her blood pressure Encourage her to continue to work on her exercies to build her strength.  Her updated medication list for this problem includes:    Amlodipine Besylate 10 Mg Tabs (Amlodipine  besylate) .Marland Kitchen... Take 1 tablet by mouth once a day  Problem # 2:  COUGH (ICD-786.2) Her PNA has resolved. She completed her meds and her lung exam is clear today.   Problem # 3:  DIASTOLIC DYSFUNCTION (ICD-429.9) She has cardiology apt scheduled.   Complete Medication List: 1)  Alprazolam 0.5 Mg Tabs (Alprazolam) .... One by mouth up to three times a day as needed anxiety 60/mo 2)  Armour Thyroid 90 Mg Tabs (Thyroid) .... Take 1 tablet by mouth once a day 3)  Metoclopramide Hcl 5 Mg Tabs (Metoclopramide hcl) .... Take one tablet by mouth in the morning and evening 4)  Amlodipine Besylate 10 Mg Tabs (Amlodipine besylate) .... Take 1 tablet by mouth once a day 5)  Simvastatin 20 Mg Tabs (Simvastatin) .... Take 1 tablet by mouth once a day at bedtime 6)  Cymbalta 30 Mg Cpep (Duloxetine hcl) .... Take 1 capsule by mouth once a day 7)  B Complex-b12 Tabs (B complex vitamins) .... Take 1 tablet by mouth once a day 8)  Prolent- Vitamin  .... Take 1 tablet by mouth once a day 9)  Lentra  .... Take 1 tablet by mouth once a day 10)  Leg Cramps  .... Take 1 tablet by mouth once a day 11)  Pantoprazole Sodium 20 Mg Tbec (Pantoprazole sodium) .... Take 1 tablet by mouth once a day 12)  Hydrocodone-homatropine 5-1.5 Mg/45ml Syrp (Hydrocodone-homatropine) .... 5ml by mouth at bedtime 13)  Melatonin 2.5 Mg Caps (Melatonin) .... Take 2 tab by mouth at bedtime 14)  Oc Max  .... Take 1 tab by mouth once daily  Patient Instructions: 1)  Please schedule a follow-up appointment in 1- 2 months.    Orders Added: 1)  Est. Patient Level III [40981]

## 2010-06-24 NOTE — Miscellaneous (Signed)
Summary: Med Record/[facility]   Med Record/[facility]   Imported By: Lanelle Bal 06/18/2010 14:03:00  _____________________________________________________________________  External Attachment:    Type:   Image     Comment:   External Document

## 2010-06-29 ENCOUNTER — Telehealth: Payer: Self-pay | Admitting: Family Medicine

## 2010-07-01 ENCOUNTER — Encounter: Payer: Self-pay | Admitting: Cardiology

## 2010-07-01 ENCOUNTER — Ambulatory Visit (INDEPENDENT_AMBULATORY_CARE_PROVIDER_SITE_OTHER): Payer: 59 | Admitting: Cardiology

## 2010-07-01 DIAGNOSIS — R0602 Shortness of breath: Secondary | ICD-10-CM

## 2010-07-01 DIAGNOSIS — E7800 Pure hypercholesterolemia, unspecified: Secondary | ICD-10-CM

## 2010-07-01 DIAGNOSIS — I10 Essential (primary) hypertension: Secondary | ICD-10-CM

## 2010-07-01 DIAGNOSIS — E785 Hyperlipidemia, unspecified: Secondary | ICD-10-CM | POA: Insufficient documentation

## 2010-07-02 NOTE — Progress Notes (Signed)
Summary: Physical therapy call  Phone Note From Other Clinic Call back at [phone]   Caller: Provider Summary of Call: Pls call Sheran Luz, physical therapist with  [facility]  Initial call taken by: Lannette Donath,  June 23, 2010 12:09 PM  Follow-up for Phone Call        cont therapy 2 x a week a couple of weeks 2-4 visit to increase walking distance. can walk 2-5 ft walking distance Follow-up by: Avon Gully CMA, Duncan Dull),  June 25, 2010 10:02 AM  Additional Follow-up for Phone Call Additional follow up Details #1::        Yes this is OK.  Additional Follow-up by: Nani Gasser MD,  June 25, 2010 1:02 PM    Additional Follow-up for Phone Call Additional follow up Details #2::    notified Physical therapist Follow-up by: Avon Gully CMA, Duncan Dull),  June 26, 2010 9:35 AM

## 2010-07-08 NOTE — Assessment & Plan Note (Signed)
Summary: [facility]   Visit Type:  Follow-up Primary Provider:  Nani Gasser MD  CC:  Weakness- Sob.  History of Present Illness: 77 year old female I initially saw in February of 2011 for evaluation of chest pain and an abnormal electrocardiogram. Myoview in February of 2011 showed an ejection fraction of 83% and normal perfusion. Echocardiogram in February of 2011 showed normal LV function, grade 1 diastolic dysfunction, mild left atrial enlargement and mild aortic insufficiency. Pt apparently was admitted in [place] in December of 2011 with a syncopal episode after urinating. It was felt to be orthostatic mediated. An echocardiogram showed normal LV function and moderate pulmonary hypertension. Since that time, the patient has had no further syncopal episodes. She does have dyspnea on exertion which is chronic. There is no orthopnea, PND, palpitations or chest pain. She has chronic pedal edema. She also has chronic weakness.  Current Medications (verified): 1)  Alprazolam 0.5 Mg Tabs (Alprazolam) .... One By Mouth Up To Three Times A Day As Needed Anxiety 60/mo 2)  Armour Thyroid 90 Mg Tabs (Thyroid) .... Take 1 Tablet By Mouth Once A Day 3)  Metoclopramide Hcl 5 Mg Tabs (Metoclopramide Hcl) .... Take One Tablet By Mouth in The Morning and Evening 4)  Amlodipine Besylate 10 Mg Tabs (Amlodipine Besylate) .... Take 1 Tablet By Mouth Once A Day 5)  Simvastatin 20 Mg Tabs (Simvastatin) .... Take 1 Tablet By Mouth Once A Day At Bedtime 6)  Cymbalta 30 Mg Cpep (Duloxetine Hcl) .... Take 1 Capsule By Mouth Once A Day 7)  B Complex-B12  Tabs (B Complex Vitamins) .... Take 1 Tablet By Mouth Once A Day 8)  Pantoprazole Sodium 20 Mg Tbec (Pantoprazole Sodium) .... Take 1 Tablet By Mouth Once A Day 9)  Melatonin 2.5 Mg Caps (Melatonin) .... Take 2 Tab By Mouth At Bedtime 10)  Tac. 5% / Aquaphor .... Apply As Directed 11)  Fish Oil 1000 Mg Caps (Omega-3 Fatty Acids) .... Take 1 Capsule  By Mouth Two Times A Day  Allergies: 1)  ! Prednisone 2)  ! Sulfa  Past History:  Past Medical History: HYPERTENSION, BENIGN (ICD-401.1) OSTEOPOROSIS (ICD-733.00) INSULIN RESISTANCE SYNDROME (ICD-259.8) ANEMIA, PERNICIOUS (ICD-281.0) LUMBAGO (ICD-724.2) HYPOTHYROIDISM (ICD-244.9) ANXIETY (ICD-300.00) DEPRESSION (ICD-311) Hyperlipidemia Remote history of peptic ulcer disease GERD  Past Surgical History: Reviewed history from 07/25/2008 and no changes required. Hysterectomy Tubal ligation Tonsillectomy  Social History: Reviewed history from 08/02/2008 and no changes required. Divorced.  Completed community college.  Lives in ass[facility] living center.   Never Smoked Alcohol use-no Drug use-no Regular exercise-no  Review of Systems       Weakness but no fevers or chills, productive cough, hemoptysis, dysphasia, odynophagia, melena, hematochezia, dysuria, hematuria, rash, seizure activity, orthopnea, PND, claudication. Remaining systems are negative.   Vital Signs:  Patient profile:   77 year old female Height:      66.5 inches Weight:      188.50 pounds BMI:     30.08 Pulse rate:   76 / minute Resp:     22 per minute BP sitting:   168 / 80  (right arm) Cuff size:   large  Vitals Entered By: Vikki Ports (July 01, 2010 3:31 PM)  Physical Exam  General:  Well-developed well-nourished in no acute distress.  Skin is warm and dry.  HEENT is normal.  Neck is supple. No thyromegaly.  Chest is clear to auscultation with normal expansion.  Cardiovascular exam is regular rate and rhythm.  Abdominal exam nontender  or distended. No masses palpated. Extremities show trace edema. neuro grossly intact    EKG  Procedure date:  07/01/2010  Findings:      Sinus rhythm at a rate of 76. Left axis deviation. Poor R-wave progression.  Impression & Recommendations:  Problem # 1:  DIASTOLIC DYSFUNCTION (ICD-429.9) Patient does have some dyspnea but this is a chronic  issue. I do not find her markedly volume overloaded on examination. I will add Toprol 25 mg p.o. daily for her diastolic dysfunction and also her hypertension. There may be a component of deconditioning to her dyspnea as well. I will check a BNP when she returns for lipids and liver in 6 weeks. We may need to add a low-dose diuretic in the future.  Problem # 2:  HYPERTENSION, BENIGN (ICD-401.1)  Blood pressure elevated. Add Toprol as described above. Check potassium and renal function. Her updated medication list for this problem includes:    Amlodipine Besylate 10 Mg Tabs (Amlodipine besylate) .Marland Kitchen... Take 1 tablet by mouth once a day    Metoprolol Succinate 25 Mg Xr24h-tab (Metoprolol succinate) .Marland Kitchen... Take one tablet by mouth daily  Orders: T-Basic Metabolic Panel [id number])  Her updated medication list for this problem includes:    Amlodipine Besylate 10 Mg Tabs (Amlodipine besylate) .Marland Kitchen... Take 1 tablet by mouth once a day    Metoprolol Succinate 25 Mg Xr24h-tab (Metoprolol succinate) .Marland Kitchen... Take one tablet by mouth daily  Problem # 3:  HYPOTHYROIDISM (ICD-244.9)  Her updated medication list for this problem includes:    Armour Thyroid 90 Mg Tabs (Thyroid) .Marland Kitchen... Take 1 tablet by mouth once a day  Her updated medication list for this problem includes:    Armour Thyroid 90 Mg Tabs (Thyroid) .Marland Kitchen... Take 1 tablet by mouth once a day  Problem # 4:  HYPERLIPIDEMIA (ICD-272.4)  Given that she is taking Norvasc 10 mg p.o. daily I will discontinue her Zocor and begin Pravachol 40 mg p.o. daily. Check lipids and liver in 6 weeks. Her updated medication list for this problem includes:    Pravastatin Sodium 40 Mg Tabs (Pravastatin sodium) .Marland Kitchen... Take one tablet by mouth daily at bedtime  Her updated medication list for this problem includes:    Pravastatin Sodium 40 Mg Tabs (Pravastatin sodium) .Marland Kitchen... Take one tablet by mouth daily at bedtime  Other Orders: T-Lipid Profile  [phone]) T-Hepatic Function [phone]) T-BNP  (B Natriuretic Peptide) [phone])  Patient Instructions: 1)  Your physician recommends that you return for lab work in:IN 6 WEEKS AFTER STARTING PRAVASTATIN-FASTING 2)  Your physician has recommended you make the following change in your medication: STOP SIMVASTATIN 3)  START PRAVASTATIN 40MG  ONCE DAILY AT BEDTIME 4)  START METOPROLOL SUCC 25MG  ONCE DAILY 5)  Your physician wants you to follow-up in: 6 MONTHS  You will receive a reminder letter in the mail two months in advance. If you don't receive a letter, please call our office to schedule the follow-up appointment. Prescriptions: METOPROLOL SUCCINATE 25 MG XR24H-TAB (METOPROLOL SUCCINATE) Take one tablet by mouth daily  #30 x 12   Entered by:   Deliah Goody, RN   Authorized by:   Ferman Hamming, MD, [facility]   Signed by:   Deliah Goody, RN on 07/01/2010   Method used:   Electronically to        [facility]* (retail)       [address]       [place], [place]  [address]       Ph:  [phone]       Fax: [phone]   RxID:   [id number] METOPROLOL SUCCINATE 25 MG XR24H-TAB (METOPROLOL SUCCINATE) Take one tablet by mouth daily  #90 x 4   Entered by:   Deliah Goody, RN   Authorized by:   Ferman Hamming, MD, [facility]   Signed by:   Deliah Goody, RN on 07/01/2010   Method used:   [facility]ed [facility] ...       [facility]             , [place]         Ph:        Fax: [phone]   RxID:   [id number] PRAVASTATIN SODIUM 40 MG TABS (PRAVASTATIN SODIUM) Take one tablet by mouth daily at bedtime  #90 x 12   Entered by:   Deliah Goody, RN   Authorized by:   Ferman Hamming, MD, [facility]   Signed by:   Deliah Goody, RN on 07/01/2010   Method used:   Faxed to ...       [facility] ([facility]             , [place]         Ph:        Fax: [phone]   RxID:   [id number]

## 2010-07-08 NOTE — Progress Notes (Signed)
Summary: [facility]-Continue OT  Phone Note From Other Clinic Call back at [phone]   Caller: Re Tu, Occuptational Therapist Summary of Call: Ms Cyndia Bent would like to continue OT twice weekly for two more weeks.  Is this OK? Initial call taken by: Francee Piccolo CMA Duncan Dull),  June 29, 2010 11:41 AM  Follow-up for Phone Call        Yes, esp if she is really benefitiing.  Follow-up by: Nani Gasser MD,  June 29, 2010 11:51 AM  Additional Follow-up for Phone Call Additional follow up Details #1::        Verbal order given, OT is OK Additional Follow-up by: Francee Piccolo CMA Duncan Dull),  June 29, 2010 4:18 PM

## 2010-07-16 ENCOUNTER — Encounter: Payer: Self-pay | Admitting: Family Medicine

## 2010-07-16 ENCOUNTER — Ambulatory Visit (INDEPENDENT_AMBULATORY_CARE_PROVIDER_SITE_OTHER): Payer: 59 | Admitting: Family Medicine

## 2010-07-16 DIAGNOSIS — G4700 Insomnia, unspecified: Secondary | ICD-10-CM

## 2010-07-16 DIAGNOSIS — I10 Essential (primary) hypertension: Secondary | ICD-10-CM

## 2010-07-16 DIAGNOSIS — R197 Diarrhea, unspecified: Secondary | ICD-10-CM

## 2010-07-17 ENCOUNTER — Encounter: Payer: Self-pay | Admitting: Family Medicine

## 2010-07-17 LAB — CONVERTED CEMR LAB: TSH: 0.093 microintl units/mL — ABNORMAL LOW (ref 0.350–4.500)

## 2010-07-20 ENCOUNTER — Ambulatory Visit: Payer: Self-pay | Admitting: Family Medicine

## 2010-07-20 ENCOUNTER — Telehealth: Payer: Self-pay | Admitting: Family Medicine

## 2010-07-23 NOTE — Assessment & Plan Note (Signed)
Summary: BP, diarrhea, insomnia.   Vital Signs:  Patient profile:   77 year old female Height:      66.5 inches Weight:      186 pounds Pulse rate:   85 / minute BP sitting:   130 / 70  (right arm) Cuff size:   large  Vitals Entered By: Avon Gully CMA, Duncan Dull) (July 16, 2010 10:47 AM) CC: f/u BP, on two new meds per cardiologist, feeling nauseaous   Primary Care Provider:  Nani Gasser MD  CC:  f/u BP, on two new meds per cardiologist, and feeling nauseaous.  History of Present Illness: f/u BP, on two new meds per cardiologist, feeling nauseaous.  Started feeling nauseated and diarrhea about the time meds changed. No blood in thehe stool or abdominal pain. Just more frequent loose stools.  Some urgency.  No urinary signs or fever. No worsening or alleviating. sxs.   Having alot of trouble of sleeping.  Almost never naps. Usually goes to bed around 9 pm.  Some night doesn't fall asleep until 1 AM. Usually happens about half the week.  Usually gets up at 7AM or 8:30 Am.  Was tested for sleep apnea about 4 years ago.  It was neg but did have extreme frequent awakenings.  Feel very fatigued because she is not sleeping well at all. Feels sometime weak from her lack of sleep. Denies any caffeine. Some night can fall asleep right awy but then will wake up around 1 and takes about 2 hours to go back to sleepl.    Her son is with her today.  Needs erfill on thyroid med but wants to get through mail order.   Current Medications (verified): 1)  Alprazolam 0.5 Mg Tabs (Alprazolam) .... One By Mouth Up To Three Times A Day As Needed Anxiety 60/mo 2)  Armour Thyroid 90 Mg Tabs (Thyroid) .... Take 1 Tablet By Mouth Once A Day 3)  Metoclopramide Hcl 5 Mg Tabs (Metoclopramide Hcl) .... Take One Tablet By Mouth in The Morning and Evening 4)  Amlodipine Besylate 10 Mg Tabs (Amlodipine Besylate) .... Take 1 Tablet By Mouth Once A Day 5)  Pravastatin Sodium 40 Mg Tabs (Pravastatin Sodium) ....  Take One Tablet By Mouth Daily At Bedtime 6)  Cymbalta 30 Mg Cpep (Duloxetine Hcl) .... Take 1 Capsule By Mouth Once A Day 7)  B Complex-B12  Tabs (B Complex Vitamins) .... Take 1 Tablet By Mouth Once A Day 8)  Pantoprazole Sodium 20 Mg Tbec (Pantoprazole Sodium) .... Take 1 Tablet By Mouth Once A Day 9)  Melatonin 2.5 Mg Caps (Melatonin) .... Take 2 Tab By Mouth At Bedtime 10)  Tac. 5% / Aquaphor .... Apply As Directed 11)  Fish Oil 1000 Mg Caps (Omega-3 Fatty Acids) .... Take 1 Capsule By Mouth Two Times A Day 12)  Metoprolol Succinate 25 Mg Xr24h-Tab (Metoprolol Succinate) .... Take One Tablet By Mouth Daily  Allergies (verified): 1)  ! Prednisone 2)  ! Sulfa  Comments:  Nurse/Medical Assistant: The patient's medications and allergies were reviewed with the patient and were updated in the Medication and Allergy Lists. Avon Gully CMA, Duncan Dull) (July 16, 2010 10:48 AM)  Social History: Reviewed history from 08/02/2008 and no changes required. Divorced.  Completed community college.  Lives in assited living center.   Never Smoked Alcohol use-no Drug use-no Regular exercise-no  Physical Exam  General:  Well-developed,well-nourished,in no acute distress; alert,appropriate and cooperative throughout examination Lungs:  Normal respiratory effort, chest  expands symmetrically. Lungs are clear to auscultation, no crackles or wheezes. Heart:  Normal rate and regular rhythm. S1 and S2 normal without gallop, murmur, click, rub or other extra sounds. Skin:  no rashes.   Cervical Nodes:  No lymphadenopathy noted   Impression & Recommendations:  Problem # 1:  DIARRHEA (ICD-787.91) Assessment New  Likley from her pravasta. Can cause nausea and diarrhea.  Have her hold for 1-2 weeks. If sxs much improved then can change.    Problem # 2:  HYPERTENSION, BENIGN (ICD-401.1) BP  looks better.  Her updated medication list for this problem includes:    Amlodipine Besylate 10 Mg Tabs  (Amlodipine besylate) .Marland Kitchen... Take 1 tablet by mouth once a day    Metoprolol Succinate 25 Mg Xr24h-tab (Metoprolol succinate) .Marland Kitchen... Take one tablet by mouth daily  Problem # 3:  INSOMNIA (ICD-780.52) Assessment: New Discussed sleep hygien. Can also try valerian root in addition to the melatonin.   If not helping after a cuple of weeks then we cna consider as needed use of a sleep aid.    Problem # 4:  HYPOTHYROIDISM (ICD-244.9) She is overdue to recheck her thyroid. Once check level can send 90 day supply to Mail order.  Her updated medication list for this problem includes:    Armour Thyroid 90 Mg Tabs (Thyroid) .Marland Kitchen... Take 1 tablet by mouth once a day  Orders: T-TSH ([id number])  Complete Medication List: 1)  Alprazolam 0.5 Mg Tabs (Alprazolam) .... One by mouth up to three times a day as needed anxiety 60/mo 2)  Armour Thyroid 90 Mg Tabs (Thyroid) .... Take 1 tablet by mouth once a day 3)  Metoclopramide Hcl 5 Mg Tabs (Metoclopramide hcl) .... Take one tablet by mouth in the morning and evening 4)  Amlodipine Besylate 10 Mg Tabs (Amlodipine besylate) .... Take 1 tablet by mouth once a day 5)  Pravastatin Sodium 40 Mg Tabs (Pravastatin sodium) .... Take one tablet by mouth daily at bedtime 6)  Cymbalta 30 Mg Cpep (Duloxetine hcl) .... Take 1 capsule by mouth once a day 7)  B Complex-b12 Tabs (B complex vitamins) .... Take 1 tablet by mouth once a day 8)  Pantoprazole Sodium 20 Mg Tbec (Pantoprazole sodium) .... Take 1 tablet by mouth once a day 9)  Melatonin 2.5 Mg Caps (Melatonin) .... Take 2 tab by mouth at bedtime 10)  Tac. 5% / Aquaphor  .... Apply as directed 11)  Fish Oil 1000 Mg Caps (Omega-3 fatty acids) .... Take 1 capsule by mouth two times a day 12)  Metoprolol Succinate 25 Mg Xr24h-tab (Metoprolol succinate) .... Take one tablet by mouth daily  Patient Instructions: 1)  Hold the pravastin for 1-2 weeks to see if nausea and diarrhea.   2)  We will call you with the thyroid  level.  3)  Can try valerian root capsules at bedtime for sleep as well.    Orders Added: 1)  T-TSH [[id number]] 2)  Est. Patient Level IV [30865]

## 2010-07-28 NOTE — Progress Notes (Signed)
Summary: [facility]-Refax rx  Phone Note Call from Patient Call back at Home Phone [phone]   Caller: Patient Summary of Call: Pt states she recevied a phone call from [facility] stating her thyroid rx was ready.  Pt wanted this rx to go to [facility].  Verified with pt she does want this filled thru [facility].  Med was d/c'd per Channing Mutters at [facility].  RX re-faxed to [facility]. Initial call taken by: Francee Piccolo CMA Duncan Dull),  July 20, 2010 12:55 PM    Prescriptions: ARMOUR THYROID 60 MG TABS (THYROID) Take 1 tablet by mouth once a day in the AM about 1 hour before breakfast.  #90 x 0   Entered by:   Francee Piccolo CMA (AAMA)   Authorized by:   Nani Gasser MD   Signed by:   Francee Piccolo CMA (AAMA) on 07/20/2010   Method used:   Re-F[facility]ed to ...       [facility]             ,          Ph:        Fax: [phone]   RxID:   [id number]

## 2010-08-03 ENCOUNTER — Telehealth (INDEPENDENT_AMBULATORY_CARE_PROVIDER_SITE_OTHER): Payer: Self-pay | Admitting: *Deleted

## 2010-08-07 ENCOUNTER — Telehealth: Payer: Self-pay | Admitting: Cardiology

## 2010-08-07 NOTE — Telephone Encounter (Signed)
Not its causing her nausea. Stay off for now. Resume in 4 weeks. If nausea returns would dc completely.

## 2010-08-07 NOTE — Telephone Encounter (Signed)
Spoke with pt, she has been having nausea and dr Michaelle Birks stopped her cholestrol med. She has been off for about three weeks now. She cont to have occ nausea that is not as bad as before. She questioned if she should restart med or change to a different one. Will forward for dr Jens Som review.

## 2010-08-10 NOTE — Telephone Encounter (Signed)
Spoke with pt, she is aware of dr Jens Som recommendations.

## 2010-08-13 ENCOUNTER — Other Ambulatory Visit: Payer: Self-pay | Admitting: Family Medicine

## 2010-08-13 NOTE — Progress Notes (Signed)
Summary: [facility] to discuss thyroid med  Phone Note Call from Patient   Caller: Patient Summary of Call: Pt LMOM requesting someone to call her back to discuss change in thyroid med.  Initial call taken by: Payton Spark CMA,  August 03, 2010 4:25 PM  Follow-up for Phone Call        Pt aware of thyroid dose change  Follow-up by: Payton Spark CMA,  August 03, 2010 5:15 PM

## 2010-08-14 ENCOUNTER — Other Ambulatory Visit: Payer: Self-pay | Admitting: Family Medicine

## 2010-08-14 MED ORDER — THYROID 90 MG PO TABS: 90.0000 mg | ORAL_TABLET | Freq: Every day | ORAL | Status: DC

## 2010-08-20 ENCOUNTER — Other Ambulatory Visit: Payer: Self-pay | Admitting: Family Medicine

## 2010-08-20 MED ORDER — THYROID 60 MG PO TABS: 90.0000 mg | ORAL_TABLET | Freq: Every day | ORAL | Status: DC

## 2010-10-07 ENCOUNTER — Other Ambulatory Visit: Payer: Self-pay | Admitting: Family Medicine

## 2010-10-07 NOTE — Telephone Encounter (Addendum)
Pt called after 4:00pm 10-06-10 requesting refill of her alprazolam 0.5 mg.Last refill on03-30-12 #30/1rf.  Too early for refill and pt notified to call [facility] next week to request and have them send request escribe.  Pt voiced understanding. Jarvis Newcomer, LPN Domingo Dimes

## 2010-10-13 ENCOUNTER — Other Ambulatory Visit: Payer: Self-pay | Admitting: Family Medicine

## 2010-10-21 ENCOUNTER — Telehealth: Payer: Self-pay | Admitting: Family Medicine

## 2010-10-21 NOTE — Telephone Encounter (Signed)
Not sure. According to centricity we haven't sent ANY rx for her except for her thyroid medication. She might want to call [facility] and make sure they have the right Nicole Duffy.

## 2010-10-21 NOTE — Telephone Encounter (Signed)
Pt called and is confused because she received call from [facility] mail order that they were sending her simvastatin medication.  Pt doesn't remember being told she was to start taking this.  The last chol labs were Jan 2012 and the note suggested to restart med but doesn't state what med. Plan:  Routed to Dr. Linford Arnold to clarifiy . Jarvis Newcomer, LPN [facility]

## 2010-10-22 NOTE — Telephone Encounter (Signed)
Pt notified to call [facility] back and tell them to double check they have the right Nicole Duffy.  We have not sent an order for simvastatin. Nicole Newcomer, LPN [facility]

## 2010-10-28 ENCOUNTER — Other Ambulatory Visit: Payer: Self-pay | Admitting: Family Medicine

## 2010-10-28 NOTE — Telephone Encounter (Signed)
Received fax from [facility] for the refill for pantoprazole 20 mg.  When chart researched pt last got script for pantoprazole 20 mg on 06-01-10     #90/1 refill.  Pt should be good on her medication til 11-30-10.  Faxed this information to [facility]. Jarvis Newcomer, LPN [facility]

## 2010-11-02 ENCOUNTER — Other Ambulatory Visit: Payer: Self-pay | Admitting: Family Medicine

## 2010-11-02 MED ORDER — DULOXETINE HCL 30 MG PO CPEP: 30.0000 mg | ORAL_CAPSULE | Freq: Every day | ORAL | Status: DC

## 2010-11-06 ENCOUNTER — Telehealth: Payer: Self-pay | Admitting: Family Medicine

## 2010-11-06 NOTE — Telephone Encounter (Signed)
Nicole Duffy with [facility] called for the pt and pt had told her she had tried multiple attempts to get prescriptions refilled for:  Protonix, armour thyroid, xanex.  When reviewing the chart all med refills are UTD and the correct mail order pharm is in the system.  LMOM for Nicole Duffy with [facility]. Nicole Newcomer, LPN Nicole Duffy

## 2010-11-10 ENCOUNTER — Other Ambulatory Visit: Payer: Self-pay | Admitting: Family Medicine

## 2010-12-05 ENCOUNTER — Other Ambulatory Visit: Payer: Self-pay | Admitting: Family Medicine

## 2010-12-05 NOTE — Telephone Encounter (Signed)
[facility] had faxed refill request for armour thyroid but pt is good on med til 12-20-10. Jarvis Newcomer, LPN [facility]

## 2010-12-17 ENCOUNTER — Other Ambulatory Visit: Payer: Self-pay | Admitting: Family Medicine

## 2010-12-29 ENCOUNTER — Encounter: Payer: Self-pay | Admitting: Cardiology

## 2011-01-18 ENCOUNTER — Other Ambulatory Visit: Payer: Self-pay | Admitting: Family Medicine

## 2011-01-25 ENCOUNTER — Telehealth: Payer: Self-pay | Admitting: Family Medicine

## 2011-01-25 ENCOUNTER — Other Ambulatory Visit: Payer: Self-pay | Admitting: Family Medicine

## 2011-01-25 MED ORDER — PANTOPRAZOLE SODIUM 20 MG PO TBEC: 20.0000 mg | DELAYED_RELEASE_TABLET | Freq: Every day | ORAL | Status: DC

## 2011-01-25 MED ORDER — DULOXETINE HCL 30 MG PO CPEP: 30.0000 mg | ORAL_CAPSULE | Freq: Every day | ORAL | Status: DC

## 2011-01-25 NOTE — Telephone Encounter (Signed)
Closed

## 2011-01-25 NOTE — Telephone Encounter (Signed)
Pt called and needs refills of pantoprazole 20 mg QD, and cymbalta 30 mg sent to [facility], and will need two week refill of each to local pharm [facility].  Pt bottles says no refills remaining even though our records read diff on the cymbalta. Plan:  Reordered her scripts for [facility] # 90 of each script.  Will send # 14 day supply of each medication to local pharm [facility].  Jarvis Newcomer, LPN [facility]'

## 2011-02-01 ENCOUNTER — Ambulatory Visit (INDEPENDENT_AMBULATORY_CARE_PROVIDER_SITE_OTHER): Payer: 59 | Admitting: Family Medicine

## 2011-02-01 ENCOUNTER — Other Ambulatory Visit: Payer: Self-pay | Admitting: Family Medicine

## 2011-02-01 ENCOUNTER — Encounter: Payer: Self-pay | Admitting: Family Medicine

## 2011-02-01 DIAGNOSIS — R0789 Other chest pain: Secondary | ICD-10-CM

## 2011-02-01 DIAGNOSIS — E039 Hypothyroidism, unspecified: Secondary | ICD-10-CM

## 2011-02-01 DIAGNOSIS — E538 Deficiency of other specified B group vitamins: Secondary | ICD-10-CM

## 2011-02-01 DIAGNOSIS — R300 Dysuria: Secondary | ICD-10-CM

## 2011-02-01 DIAGNOSIS — N760 Acute vaginitis: Secondary | ICD-10-CM

## 2011-02-01 DIAGNOSIS — R55 Syncope and collapse: Secondary | ICD-10-CM

## 2011-02-01 LAB — CBC WITH DIFFERENTIAL/PLATELET
Basophils Absolute: 0 10*3/uL (ref 0.0–0.1)
Basophils Relative: 0 % (ref 0–1)
HCT: 45.3 % (ref 36.0–46.0)
Hemoglobin: 14.4 g/dL (ref 12.0–15.0)
Lymphocytes Relative: 33 % (ref 12–46)
MCHC: 31.8 g/dL (ref 30.0–36.0)
Monocytes Absolute: 0.5 10*3/uL (ref 0.1–1.0)
Monocytes Relative: 5 % (ref 3–12)
Neutro Abs: 6.4 10*3/uL (ref 1.7–7.7)
Neutrophils Relative %: 62 % (ref 43–77)
WBC: 10.3 10*3/uL (ref 4.0–10.5)

## 2011-02-01 LAB — POCT URINALYSIS DIPSTICK
Blood, UA: NEGATIVE
Glucose, UA: NEGATIVE
Nitrite, UA: NEGATIVE

## 2011-02-01 LAB — TSH: TSH: 0.48 u[IU]/mL (ref 0.350–4.500)

## 2011-02-01 LAB — VITAMIN B12: Vitamin B-12: 437 pg/mL (ref 211–911)

## 2011-02-01 LAB — CK: Total CK: 37 U/L (ref 7–177)

## 2011-02-01 MED ORDER — FLUCONAZOLE 150 MG PO TABS: 150.0000 mg | ORAL_TABLET | Freq: Once | ORAL | Status: AC

## 2011-02-01 NOTE — Progress Notes (Signed)
  Subjective:    Patient ID: Nicole Duffy, female    DOB: April 04, 1932, 77 y.o.   MRN: [id number]  HPI Severe HA daily for about 9 months.  Ws a the beauty shop and started to feel like things were getting further and further away. She never addressed with speaking but she couldn't understand what she was saying. She was cold and clammy and sweaty. She never fully passed out or lost consciousness. Sometimes when nervous will shake.  Has been very anxious lately. A couple weeks ago she called her son everyday to talk with him because she was so nervous that she couldn't pinpoint what she was actually nervous about. No regular exercise.  Feels really weak at times. Some CP and SOB at times but not necessarily during the near syncopal episodes. Having some nightmares as well.  Some occ palpitations.  She doesn't want to increase her Cymbalta because when she's gone up to 60 mg in the past she's had more frequent nightmares.  Vaginal itching.  Does have occ incontinence.  She does wear adult diapers.  She is not taking her vitamin B 12 regularly.   Having occ stool incontinenece intermittently.  She continues to have loose stools. Review of Systems     Objective:   Physical Exam  Constitutional: She is oriented to person, place, and time. She appears well-developed and well-nourished.  HENT:  Head: Normocephalic and atraumatic.  Cardiovascular: Normal rate, regular rhythm and normal heart sounds.        No carotid bruits.   Pulmonary/Chest: Effort normal and breath sounds normal.  Neurological: She is alert and oriented to person, place, and time.  Skin: Skin is warm and dry.  Psychiatric: She has a normal mood and affect. Her behavior is normal.          Assessment & Plan:  Near syncope-unclear etiology. This has been going on for the last year. She was admitted to the hospital last December for a syncopal event. I would check old records to see if she has had carotid Dopplers et Karie Soda.  Her EKG today shows a rate of 66 beats per minute with normal sinus rhythm. No acute changes. No arrhythmias. Will check CBC, Vit B12, to rule out anemia as this can certainly cause weakness and near syncope. If this is normal and not like her to see a cardiologist and consider wearing a heart monitor to rule out an arrhythmia. Recheck TSH to make sure that it's well-controlled.  Urinary incontinence-urinalysis was performed. It was positive for trace leukocytes also in the culture. The likely she does have stress incontinence and possibly overactive bladder. We could certainly consider medication such as she's going out of the house to keep this under better control.  Vaginal itching-L2 and treat her with Diflucan. I did have her perform a wet prep today so we will call with the results.  Weakness and fatigue-can also consider stopping her statin and see if she feels better.

## 2011-02-02 LAB — COMPLETE METABOLIC PANEL WITH GFR
ALT: 10 U/L (ref 0–35)
AST: 14 U/L (ref 0–37)
Albumin: 4.3 g/dL (ref 3.5–5.2)
Alkaline Phosphatase: 89 U/L (ref 39–117)
BUN: 10 mg/dL (ref 6–23)
CO2: 23 mEq/L (ref 19–32)
Calcium: 9.8 mg/dL (ref 8.4–10.5)
Chloride: 103 mEq/L (ref 96–112)
Creat: 1.11 mg/dL — ABNORMAL HIGH (ref 0.50–1.10)
GFR, Est African American: 58 mL/min — ABNORMAL LOW (ref >60)
GFR, Est Non African American: 48 mL/min — ABNORMAL LOW (ref >60)
Glucose, Bld: 141 mg/dL — ABNORMAL HIGH (ref 70–99)
Potassium: 4.5 mEq/L (ref 3.5–5.3)
Sodium: 139 mEq/L (ref 135–145)
Total Bilirubin: 0.9 mg/dL (ref 0.3–1.2)
Total Protein: 6.5 g/dL (ref 6.0–8.3)

## 2011-02-03 ENCOUNTER — Telehealth: Payer: Self-pay | Admitting: *Deleted

## 2011-02-03 LAB — WET PREP, GENITAL
Trich, Wet Prep: NONE SEEN
WBC, Wet Prep HPF POC: NONE SEEN
Yeast Wet Prep HPF POC: NONE SEEN

## 2011-02-03 NOTE — Telephone Encounter (Signed)
Pt.notified

## 2011-02-03 NOTE — Telephone Encounter (Signed)
Message copied by Wyline Beady on Wed Feb 03, 2011  3:21 PM ------      Message from: Nani Gasser D      Created: Tue Feb 02, 2011  9:41 PM       Vitamin B12 are normal. CBC is nl. No anemia. Muscle enzymes is normal. CMP is ok except sugar is 141. Thyroid is normal. See if lab can add an A1C.  Also can you check on results of wet prep. Usually we get that back really quickly.

## 2011-02-03 NOTE — Telephone Encounter (Signed)
Message copied by Wyline Beady on Wed Feb 03, 2011  3:22 PM ------      Message from: Nani Gasser D      Created: Wed Feb 03, 2011  2:42 PM       Wet prep is nl. No yeast infection.

## 2011-02-03 NOTE — Telephone Encounter (Signed)
Pt notified; called and [facility] computers are down at the moment so cant add labs

## 2011-02-04 LAB — URINE CULTURE: Organism ID, Bacteria: NO GROWTH

## 2011-02-05 ENCOUNTER — Telehealth: Payer: Self-pay | Admitting: *Deleted

## 2011-02-05 ENCOUNTER — Other Ambulatory Visit: Payer: Self-pay | Admitting: Family Medicine

## 2011-02-05 NOTE — Telephone Encounter (Signed)
Pt notified and labs added.

## 2011-02-05 NOTE — Telephone Encounter (Signed)
Message copied by Wyline Beady on Fri Feb 05, 2011  3:14 PM ------      Message from: Nani Gasser D      Created: Tue Feb 02, 2011  9:41 PM       Vitamin B12 are normal. CBC is nl. No anemia. Muscle enzymes is normal. CMP is ok except sugar is 141. Thyroid is normal. See if lab can add an A1C.  Also can you check on results of wet prep. Usually we get that back really quickly.

## 2011-02-05 NOTE — Telephone Encounter (Signed)
Pt notifed.

## 2011-02-08 ENCOUNTER — Telehealth: Payer: Self-pay | Admitting: *Deleted

## 2011-02-08 NOTE — Telephone Encounter (Signed)
Message copied by Lanae Crumbly on Mon Feb 08, 2011  3:07 PM ------      Message from: Nani Gasser D      Created: Sun Feb 07, 2011  1:23 PM       HgbA1C was 5.7 which looks good.

## 2011-02-08 NOTE — Telephone Encounter (Signed)
Pt notified of results. KJ LPN 

## 2011-02-10 ENCOUNTER — Telehealth: Payer: Self-pay | Admitting: Family Medicine

## 2011-02-10 NOTE — Telephone Encounter (Signed)
Pt called and wants her recent lab results mailed to her home. Plan:  Printed and mailed to the pt.  Pt aware. Jarvis Newcomer, LPN [facility]

## 2011-02-12 ENCOUNTER — Other Ambulatory Visit: Payer: Self-pay | Admitting: Family Medicine

## 2011-02-16 ENCOUNTER — Encounter: Payer: Self-pay | Admitting: Family Medicine

## 2011-03-08 ENCOUNTER — Ambulatory Visit: Payer: 59 | Admitting: Family Medicine

## 2011-03-15 ENCOUNTER — Other Ambulatory Visit: Payer: Self-pay | Admitting: Family Medicine

## 2011-03-23 ENCOUNTER — Other Ambulatory Visit: Payer: Self-pay | Admitting: Family Medicine

## 2011-03-30 ENCOUNTER — Other Ambulatory Visit: Payer: Self-pay | Admitting: Family Medicine

## 2011-04-16 ENCOUNTER — Other Ambulatory Visit: Payer: Self-pay | Admitting: Family Medicine

## 2011-04-29 ENCOUNTER — Encounter: Payer: Self-pay | Admitting: Family Medicine

## 2011-04-29 ENCOUNTER — Ambulatory Visit (INDEPENDENT_AMBULATORY_CARE_PROVIDER_SITE_OTHER): Payer: 59 | Admitting: Family Medicine

## 2011-04-29 DIAGNOSIS — J329 Chronic sinusitis, unspecified: Secondary | ICD-10-CM

## 2011-04-29 DIAGNOSIS — J40 Bronchitis, not specified as acute or chronic: Secondary | ICD-10-CM

## 2011-04-29 MED ORDER — HYDROCODONE-HOMATROPINE 5-1.5 MG/5ML PO SYRP: 5.0000 mL | ORAL_SOLUTION | Freq: Every evening | ORAL | Status: AC | PRN

## 2011-04-29 MED ORDER — AMOXICILLIN-POT CLAVULANATE 875-125 MG PO TABS: 1.0000 | ORAL_TABLET | Freq: Two times a day (BID) | ORAL | Status: AC

## 2011-04-29 NOTE — Patient Instructions (Signed)
Acute Bronchitis Bronchitis is when the organs and tissues involved in breathing get puffy (swollen) and can leak fluid. This makes it harder for air to get in and out of the lungs. You may cough a lot and produce thick spit (mucus). Acute means the illness started suddenly. HOME CARE  Rest.     Drink enough fluids to keep the pee (urine) clear or pale yellow.     Medicines may be given that will open up your airways to help you breathe better. Only take medicine as told by your doctor.     Use a cool mist vaporizer. This will help to thin any thick spit.     Do not smoke. Avoid secondhand smoke.  GET HELP RIGHT AWAY IF:    You have a temperature by mouth above 102 F (38.9 C), not controlled by medicine.     You have chills.     You develop severe shortness of breath or chest pain.     You have bloody spit mixed with mucus (sputum).     You throw up (vomit) often.     You lose too much body fluid (dehydrated).     You have a severe headache.     You feel faint.     You do not improve after 1 week of treatment.  MAKE SURE YOU:    Understand these instructions.     Will watch your condition.     Will get help right away if you are not doing well or get worse.  Document Released: 10/20/2007 Document Revised: 01/13/2011 Document Reviewed: 05/21/2009 [facility] Patient Information 2012 [place], [place].Sinusitis Sinuses are air pockets within the bones of your face. The growth of bacteria within a sinus leads to infection. The infection prevents the sinuses from draining. This infection is called sinusitis. SYMPTOMS   There will be different areas of pain depending on which sinuses have become infected.  The maxillary sinuses often produce pain beneath the eyes.     Frontal sinusitis may cause pain in the middle of the forehead and above the eyes.  Other problems (symptoms) include:  Toothaches.     Colored, pus-like (purulent) drainage from the nose.     Swelling,  warmth, and tenderness over the sinus areas may be signs of infection.  TREATMENT   Sinusitis is most often determined by an exam.X-rays may be taken. If x-rays have been taken, make sure you obtain your results or find out how you are to obtain them. Your caregiver may give you medications (antibiotics). These are medications that will help kill the bacteria causing the infection. You may also be given a medication (decongestant) that helps to reduce sinus swelling.   HOME CARE INSTRUCTIONS    Only take over-the-counter or prescription medicines for pain, discomfort, or fever as directed by your caregiver.     Drink extra fluids. Fluids help thin the mucus so your sinuses can drain more easily.     Applying either moist heat or ice packs to the sinus areas may help relieve discomfort.     Use saline nasal sprays to help moisten your sinuses. The sprays can be found at your local drugstore.  SEEK IMMEDIATE MEDICAL CARE IF:  You have a fever.     You have increasing pain, severe headaches, or toothache.     You have nausea, vomiting, or drowsiness.     You develop unusual swelling around the face or trouble seeing.  MAKE SURE YOU:  Understand these instructions.     Will watch your condition.     Will get help right away if you are not doing well or get worse.  Document Released: 05/03/2005 Document Revised: 01/13/2011 Document Reviewed: 11/30/2006 [facility] Patient Information 2012 [place], [place].

## 2011-04-29 NOTE — Progress Notes (Signed)
  Subjective:    Patient ID: Nicole Duffy, female    DOB: 06-20-1931, 77 y.o.   MRN: [id number]  HPI Productive cough with yellow/green phlegm. Has been getting SOB with her cough the last couple of days. + chills. No measured fever. Has been lighth-headed. + Mild ST and ear pain and pressure.  Mild sinus pressure. Hearing a rattle in her chest.  Taking propulis a natural antiobiotic.  Also using Tyelenol.  + hoarse.  Nasal congestion now, initially it was runny.  + sneezing, itchy burning eyes.   Review of Systems     Objective:   Physical Exam  Constitutional: She is oriented to person, place, and time. She appears well-developed and well-nourished.  HENT:  Head: Normocephalic and atraumatic.  Right Ear: External ear normal.  Left Ear: External ear normal.  Nose: Nose normal.       Left TM and canals are clear. Op is moist but mildly erythematous.  Right TM is blocked with cerumen.   Eyes: Conjunctivae and EOM are normal. Pupils are equal, round, and reactive to light.  Neck: Neck supple. No thyromegaly present.  Cardiovascular: Normal rate, regular rhythm and normal heart sounds.   Pulmonary/Chest: Effort normal and breath sounds normal. She has no wheezes.       Course rhonchi diffusely.   Lymphadenopathy:    She has no cervical adenopathy.  Neurological: She is alert and oriented to person, place, and time.  Skin: Skin is warm.       + clammy  Psychiatric: She has a normal mood and affect.          Assessment & Plan:  Bronchitis/sinusitis - will tx with augmentin bid x 10 days. Call if not better in one week. Given NEB in the office. She felt lightheaded afterwards. She doesn't tolerate steroid. She didn't want a rx for an inhaler.  Cough med given. Stay hydrated. Looks hydrated today. Last time had bronchit8is was a year ago.

## 2011-04-30 MED ORDER — ALBUTEROL SULFATE (2.5 MG/3ML) 0.083% IN NEBU
2.5000 mg | INHALATION_SOLUTION | Freq: Four times a day (QID) | RESPIRATORY_TRACT | Status: DC | PRN
  Administered 2011-04-30: 2.5 mg via RESPIRATORY_TRACT

## 2011-05-17 ENCOUNTER — Other Ambulatory Visit: Payer: Self-pay | Admitting: Family Medicine

## 2011-05-26 ENCOUNTER — Ambulatory Visit (INDEPENDENT_AMBULATORY_CARE_PROVIDER_SITE_OTHER): Payer: 59 | Admitting: Family Medicine

## 2011-05-26 ENCOUNTER — Encounter: Payer: Self-pay | Admitting: Family Medicine

## 2011-05-26 VITALS — BP 148/78 | HR 91 | Temp 98.2°F | Ht 66.0 in | Wt 192.0 lb

## 2011-05-26 DIAGNOSIS — L608 Other nail disorders: Secondary | ICD-10-CM

## 2011-05-26 DIAGNOSIS — E039 Hypothyroidism, unspecified: Secondary | ICD-10-CM

## 2011-05-26 DIAGNOSIS — G4700 Insomnia, unspecified: Secondary | ICD-10-CM

## 2011-05-26 DIAGNOSIS — G43909 Migraine, unspecified, not intractable, without status migrainosus: Secondary | ICD-10-CM | POA: Insufficient documentation

## 2011-05-26 DIAGNOSIS — Z87828 Personal history of other (healed) physical injury and trauma: Secondary | ICD-10-CM

## 2011-05-26 DIAGNOSIS — F419 Anxiety disorder, unspecified: Secondary | ICD-10-CM

## 2011-05-26 DIAGNOSIS — H539 Unspecified visual disturbance: Secondary | ICD-10-CM

## 2011-05-26 DIAGNOSIS — L609 Nail disorder, unspecified: Secondary | ICD-10-CM

## 2011-05-26 DIAGNOSIS — R51 Headache: Secondary | ICD-10-CM

## 2011-05-26 DIAGNOSIS — F411 Generalized anxiety disorder: Secondary | ICD-10-CM

## 2011-05-26 NOTE — Progress Notes (Signed)
Subjective:    Patient ID: Nicole Duffy, female    DOB: Dec 11, 1931, 78 y.o.   MRN: [id number]  HPI HA daily, often wake up with them. Gets worse during the day. Take Tylenol and helps some. Using it daily. 3-5 Tylenol a day. Will get really nauseated. Will spots in her vision during her HA.  No hearing changes.  No fever.  She says she does have a history of headaches her whole life but they start getting more frequent since 2011 after a fall that resulted in a head injury.  She says that over the past one to 2 months her headaches have been daily and she feels they're getting worse. Feels really jittery and nervous.  Having nightmares.  She has been taking melatonin for sleep and wonders if that could be causing her nightmares. She says it helps some but doesn't feel it works well. She has a history of chronic diarrhea and says that surprisingly her bowels have actually been normal the last 3 days. She says it has been years since she has had normal bowel movements.   Review of Systems BP 148/78  Pulse 91  Temp(Src) 98.2 F (36.8 C) (Oral)  Ht 5\' 6"  (1.676 m)  Wt 192 lb (87.091 kg)  BMI 30.99 kg/m2  SpO2 95%    Allergies  Allergen Reactions  . Prednisone     REACTION: GI Upset  . Sulfonamide Derivatives     REACTION: Hives, Rash    Past Medical History  Diagnosis Date  . Benign essential HTN   . Osteoporosis   . Insulin resistance syndrome   . Anemia, pernicious   . Anxiety   . Hypothyroidism   . Anxiety   . Depressed   . HLD (hyperlipidemia)   . GERD (gastroesophageal reflux disease)   . PUD (peptic ulcer disease)     remote    Past Surgical History  Procedure Date  . Hysterectomy (other)   . Tubal ligation   . Tonsillectomy     History   Social History  . Marital Status: Divorced    Spouse Name: N/A    Number of Children: N/A  . Years of Education: N/A   Occupational History  . Not on file.   Social History Main Topics  . Smoking status: Never Smoker     . Smokeless tobacco: Not on file  . Alcohol Use: No  . Drug Use: No  . Sexually Active:    Other Topics Concern  . Not on file   Social History Narrative   Lives in assisted living center. Does not exercise.     Family History  Problem Relation Age of Onset  . Diabetes Brother   . Colon cancer Other     1st degree relative <60  . Ovarian cancer Other   . Hypertension Brother   . Hyperlipidemia Brother   . Depression Brother         Objective:   Physical Exam  Constitutional: She is oriented to person, place, and time. She appears well-developed and well-nourished.  HENT:  Head: Normocephalic and atraumatic.  Eyes: Conjunctivae are normal. Pupils are equal, round, and reactive to light.  Neck: Neck supple. No thyromegaly present.  Cardiovascular: Normal rate and regular rhythm.        2/6 SEM  Pulmonary/Chest: Effort normal and breath sounds normal.  Lymphadenopathy:    She has no cervical adenopathy.  Neurological: She is alert and oriented to person, place, and time. She has normal  reflexes. She displays normal reflexes. No cranial nerve deficit. She exhibits normal muscle tone. Coordination normal.  Skin: Skin is warm and dry.  Psychiatric: She has a normal mood and affect. Her behavior is normal. Judgment and thought content normal.          Assessment & Plan:  Chronic daily headache with a history of migraine headaches-we discussed stopping the Tylenol issue certainly could be any rebound medications cycle. Because she is having some vision changes, and her age, and her history of head injury, I would like to get an MRI of her head. This patient did have a history of migraines these typically improved considerably once we hit menopause. She is already on a beta blocker which I think would help her headaches. We could consider amitriptyline at bedtime but she does take Cymbalta already. Also consider this could be a medication side effect.  Anxiety-we'll discuss  going up to a whole tab on her Xanax instead of a half and see if this helps control her symptoms better. Hopefully she'll be less anxious and will not call her son quite as frequently. I will also recheck her thyroid, but we did check it 3 months ago, to make sure that this is not increasing her anxiety level.  Insomnia-stopped melatonin since this could possibly be causing nightmares though this certainly would be a rare side effect. I would like her to try a whole tablet for Xanax at bedtime instead of a half and see if this helps with her sleep. If after one to 2 weeks it is not helping and may consider another type of sleep aid.  She was also peripheral to podiatry to help her take care of her nails and feet. She gets very thick calluses and does have dystrophic nails.

## 2011-05-28 ENCOUNTER — Other Ambulatory Visit: Payer: Self-pay | Admitting: Family Medicine

## 2011-05-28 LAB — CBC
Hemoglobin: 14.1 g/dL (ref 12.0–15.0)
MCH: 28.2 pg (ref 26.0–34.0)
MCHC: 31.4 g/dL (ref 30.0–36.0)
Platelets: 259 10*3/uL (ref 150–400)
RDW: 13.5 % (ref 11.5–15.5)

## 2011-05-28 LAB — TSH: TSH: 1.667 u[IU]/mL (ref 0.350–4.500)

## 2011-05-28 LAB — SEDIMENTATION RATE: Sed Rate: 8 mm/hr (ref 0–22)

## 2011-06-16 ENCOUNTER — Ambulatory Visit
Admission: RE | Admit: 2011-06-16 | Discharge: 2011-06-16 | Disposition: A | Discharge status: Home / Self Care | Payer: 59 | Source: Ambulatory Visit | Attending: Family Medicine | Admitting: Family Medicine

## 2011-06-16 ENCOUNTER — Other Ambulatory Visit: Payer: Self-pay | Admitting: Family Medicine

## 2011-06-16 DIAGNOSIS — H539 Unspecified visual disturbance: Secondary | ICD-10-CM

## 2011-06-16 MED ORDER — GADOBENATE DIMEGLUMINE 529 MG/ML IV SOLN
18.0000 mL | Freq: Once | INTRAVENOUS | Status: AC | PRN
  Administered 2011-06-16: 18 mL via INTRAVENOUS

## 2011-06-17 ENCOUNTER — Other Ambulatory Visit: Payer: Self-pay | Admitting: Family Medicine

## 2011-06-21 ENCOUNTER — Other Ambulatory Visit: Payer: Self-pay | Admitting: Family Medicine

## 2011-06-24 ENCOUNTER — Other Ambulatory Visit: Payer: Self-pay | Admitting: Family Medicine

## 2011-06-24 ENCOUNTER — Other Ambulatory Visit: Payer: Self-pay | Admitting: *Deleted

## 2011-06-24 MED ORDER — METOPROLOL SUCCINATE ER 25 MG PO TB24: 25.0000 mg | ORAL_TABLET | Freq: Every day | ORAL | Status: DC

## 2011-06-28 ENCOUNTER — Ambulatory Visit (INDEPENDENT_AMBULATORY_CARE_PROVIDER_SITE_OTHER): Payer: 59 | Admitting: Family Medicine

## 2011-06-28 ENCOUNTER — Encounter: Payer: Self-pay | Admitting: Family Medicine

## 2011-06-28 DIAGNOSIS — E8881 Metabolic syndrome: Secondary | ICD-10-CM | POA: Insufficient documentation

## 2011-06-28 DIAGNOSIS — Z780 Asymptomatic menopausal state: Secondary | ICD-10-CM

## 2011-06-28 DIAGNOSIS — R51 Headache: Secondary | ICD-10-CM

## 2011-06-28 DIAGNOSIS — F329 Major depressive disorder, single episode, unspecified: Secondary | ICD-10-CM

## 2011-06-28 DIAGNOSIS — E669 Obesity, unspecified: Secondary | ICD-10-CM

## 2011-06-28 DIAGNOSIS — I10 Essential (primary) hypertension: Secondary | ICD-10-CM

## 2011-06-28 MED ORDER — BUPROPION HCL ER (XL) 150 MG PO TB24: 150.0000 mg | ORAL_TABLET | Freq: Every day | ORAL | Status: DC

## 2011-06-28 NOTE — Progress Notes (Signed)
  Subjective:    Patient ID: Nicole Duffy, female    DOB: January 19, 1932, 78 y.o.   MRN: [id number]  HPI Had bad HA years ago and it was mold exposure.  Son says they checked her coffee pot and it was full of mold.  They cleaned it about a week ago.  She says she does feel some better this week so far.   Mood- Had to get motivated in the AM. Didn't see her son and grandkids at Christmas bc didn't feel well. Says has been more irritabloe latelyl; She feels she may be depressed. She takes her cymbalta 30mg ,  regularly didn't toelrate 60mg  in the past.    Review of Systems     Objective:   Physical Exam  Constitutional: She is oriented to person, place, and time. She appears well-developed and well-nourished.  Neurological: She is alert and oriented to person, place, and time.  Skin: Skin is warm and dry.  Psychiatric: She has a normal mood and affect. Her behavior is normal.          Assessment & Plan:  Depression  - PHQ- 9 score of 20 (severe) depression and GAD - 7 score of  16 for anxiety Today.  Discussed different options. She is already on Cymbalta 30 mg. She did not tolerate the 60 mg in the past because she says it caused hallucinations. Thus we discussed options of taking her off the Cymbalta to hesitate because it does help control some chronic pain for her. Thus we decided to start Wellbutrin 150 mg extended release once a day. Followup in one month to make sure she is tolerating this well and not having any side effects. If this is not successful then we will discontinue the Cymbalta and try an SSRI in its place.  HA - Reviewed MRI results. Will see if improves now that mold has been cleaned from coffee pot. Cal if not sig better in 2 weeks. She see neurology since she has had a normal MRI and since her blood work has been normal.  HTN- Well controlled today.

## 2011-07-01 ENCOUNTER — Ambulatory Visit (INDEPENDENT_AMBULATORY_CARE_PROVIDER_SITE_OTHER): Payer: 59 | Admitting: Family Medicine

## 2011-07-01 ENCOUNTER — Encounter: Payer: Self-pay | Admitting: Family Medicine

## 2011-07-01 VITALS — BP 130/60 | HR 56 | Temp 97.9°F | Ht 66.0 in | Wt 192.0 lb

## 2011-07-01 DIAGNOSIS — G510 Bell's palsy: Secondary | ICD-10-CM

## 2011-07-01 DIAGNOSIS — B0223 Postherpetic polyneuropathy: Secondary | ICD-10-CM

## 2011-07-01 DIAGNOSIS — G500 Trigeminal neuralgia: Secondary | ICD-10-CM

## 2011-07-01 MED ORDER — TEARS RENEWED OP SOLN: 1.0000 [drp] | Freq: Four times a day (QID) | OPHTHALMIC | Status: DC | PRN

## 2011-07-01 MED ORDER — OXYCODONE-ACETAMINOPHEN 5-325 MG PO TABS: 1.0000 | ORAL_TABLET | Freq: Three times a day (TID) | ORAL | Status: AC | PRN

## 2011-07-01 MED ORDER — VALACYCLOVIR HCL 1 G PO TABS: 1000.0000 mg | ORAL_TABLET | Freq: Three times a day (TID) | ORAL | Status: AC

## 2011-07-01 MED ORDER — TEARS RENEWED OP SOLN: 1.0000 [drp] | Freq: Four times a day (QID) | OPHTHALMIC | Status: AC | PRN

## 2011-07-01 NOTE — Progress Notes (Signed)
  Subjective:    Patient ID: Nicole Duffy, female    DOB: Jan 31, 1932, 78 y.o.   MRN: [id number]  HPI Patient is here for a rash on her face . This started yesterday. It was a rash on the L side of her face. She reports headache and pain in her L side of her face.  Review of Systems  Eyes: Positive for pain.  Neurological: Positive for facial asymmetry, light-headedness and headaches.  All other systems reviewed and are negative.      BP 130/60  Pulse 56  Temp(Src) 97.9 F (36.6 C) (Oral)  SpO2 96% Objective:   Physical Exam  Constitutional: She appears well-developed.  HENT:  Head: Atraumatic.    Right Ear: External ear normal.  Left Ear: External ear normal.  Nose: Right sinus exhibits no maxillary sinus tenderness. Left sinus exhibits no maxillary sinus tenderness.       Patient has a rash over the left side of her face involving left eye the swelling of this area the face and is also some facial weakness of the lower lower mouth and some difficulty closing the left eye completely. The rash is around the eye underneath the left eye is also on most areas of swelling and appears in some vesicles are beginning to appear.  Eyes: No scleral icterus.  Neck: Neck supple.          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 Followup Dr. Linford Arnold in 2 weeks . Also for pain after talking with his son Percocet 05/325 one by mouth every 8 hours when necessary for pain. If

## 2011-07-01 NOTE — Patient Instructions (Signed)
Shingles Shingles is caused by the same virus that causes chickenpox (varicella zoster virus or VZV). Shingles often occurs many years or decades after having chickenpox. That is why it is more common in adults older than 50 years. The virus reactivates and breaks out as an infection in a nerve root. SYMPTOMS   The initial feeling (sensations) may be pain. This pain is usually described as:   Burning.   Stabbing.   Throbbing.   Tingling in the nerve root.   A red rash will follow in a couple days. The rash may occur in any area of the body and is usually on one side (unilateral) of the body in a band or belt-like pattern. The rash usually starts out as very small blisters (vesicles). They will dry up after 7 to 10 days. This is not usually a significant problem except for the pain it causes.   Long-lasting (chronic) pain is more likely in an elderly person. It can last months to years. This condition is called postherpetic neuralgia.  Shingles can be an extremely severe infection in someone with AIDS, a weakened immune system, or with forms of leukemia. It can also be severe if you are taking transplant medicines or other medicines that weaken the immune system. TREATMENT  Your caregiver will often treat you with:  Antiviral drugs.   Anti-inflammatory drugs.   Pain medicines.  Bed rest is very important in preventing the pain associated with herpes zoster (postherpetic neuralgia). Application of heat in the form of a hot water bottle or electric heating pad or gentle pressure with the hand is recommended to help with the pain or discomfort. PREVENTION  A varicella zoster vaccine is available to help protect against the virus. The Food and Drug Administration approved the varicella zoster vaccine for individuals 76 years of age and older. HOME CARE INSTRUCTIONS   Cool compresses to the area of rash may be helpful.   Only take over-the-counter or prescription medicines for pain,  discomfort, or fever as directed by your caregiver.   Avoid contact with:   Babies.   Pregnant women.   Children with eczema.   Elderly people with transplants.   People with chronic illnesses, such as leukemia and AIDS.   If the area involved is on your face, you may receive a referral for follow-up to a specialist. It is very important to keep all follow-up appointments. This will help avoid eye complications, chronic pain, or disability.  SEEK IMMEDIATE MEDICAL CARE IF:   You develop any pain (headache) in the area of the face or eye. This must be followed carefully by your caregiver or ophthalmologist. An infection in part of your eye (cornea) can be very serious. It could lead to blindness.   You do not have pain relief from prescribed medicines.   Your redness or swelling spreads.   The area involved becomes very swollen and painful.   You have a fever.   You notice any red or painful lines extending away from the affected area toward your heart (lymphangitis).   Your condition is worsening or has changed.  Document Released: 05/03/2005 Document Revised: 01/13/2011 Document Reviewed: 04/07/2009 [facility] Patient Information 2012 [place], [place].Bell's Palsy Bell's palsy is a condition in which the muscles on one side of the face cannot move (paralysis). This is because the nerves in the face are paralyzed. It is most often thought to be caused by a virus. The virus causes swelling of the nerve that controls movement on one  side of the face. The nerve travels through a tight space surrounded by bone. When the nerve swells, it can be compressed by the bone. This results in damage to the protective covering around the nerve. This damage interferes with how the nerve communicates with the muscles of the face. As a result, it can cause weakness or paralysis of the facial muscles.  Injury (trauma), tumor, and surgery may cause Bell's palsy, but most of the time the cause is unknown.  It is a relatively common condition. It starts suddenly (abrupt onset) with the paralysis usually ending within 2 days. Bell's palsy is not dangerous. But because the eye does not close properly, you may need care to keep the eye from getting dry. This can include splinting (to keep the eye shut) or moistening with artificial tears. Bell's palsy very seldom occurs on both sides of the face at the same time. SYMPTOMS   Eyebrow sagging.   Drooping of the eyelid and corner of the mouth.   Inability to close one eye.   Loss of taste on the front of the tongue.   Sensitivity to loud noises.  TREATMENT  The treatment is usually non-surgical. If the patient is seen within the first 24 to 48 hours, a short course of steroids may be prescribed, in an attempt to shorten the length of the condition. Antiviral medicines may also be used with the steroids, but it is unclear if they are helpful.  You will need to protect your eye, if you cannot close it. The cornea (clear covering over your eye) will become dry and can be damaged. Artificial tears can be used to keep your eye moist. Glasses or an eye patch should be worn to protect your eye. PROGNOSIS  Recovery is variable, ranging from days to months. Although the problem usually goes away completely (about 80% of cases resolve), predicting the outcome is impossible. Most people improve within 3 weeks of when the symptoms began. Improvement may continue for 3 to 6 months. A small number of people have moderate to severe weakness that is permanent.  HOME CARE INSTRUCTIONS   If your caregiver prescribed medication to reduce swelling in the nerve, use as directed. Do not stop taking the medication unless directed by your caregiver.   Use moisturizing eye drops as needed to prevent drying of your eye, as directed by your caregiver.   Protect your eye, as directed by your caregiver.   Use facial massage and exercises, as directed by your caregiver.    Perform your normal activities, and get your normal rest.  SEEK IMMEDIATE MEDICAL CARE IF:   There is pain, redness or irritation in the eye.   You or your child has an oral temperature above 102 F (38.9 C), not controlled by medicine.  MAKE SURE YOU:   Understand these instructions.   Will watch your condition.   Will get help right away if you are not doing well or get worse.  Document Released: 05/03/2005 Document Revised: 01/13/2011 Document Reviewed: 05/12/2009 [facility] Patient Information 2012 [place], [place].Trigeminal Neuralgia Trigeminal neuralgia is a nerve disorder that causes sudden attacks of severe facial pain. It is caused by damage to the trigeminal nerve, a major nerve in the face. It is more common in women and in the elderly, although it can also happen in younger patients. Attacks last from a few seconds to several minutes and can occur from a couple of times per year to several times per day. Trigeminal neuralgia can  be a very distressing and disabling condition. Surgery may be needed in very severe cases if medical treatment does not give relief. HOME CARE INSTRUCTIONS   If your caregiver prescribed medication to help prevent attacks, take as directed.   To help prevent attacks:   Chew on the unaffected side of the mouth.   Avoid touching your face.   Avoid blasts of hot or cold air.   Men may wish to grow a beard to avoid having to shave.  SEEK IMMEDIATE MEDICAL CARE IF:  Pain is unbearable and your medicine does not help.   You develop new, unexplained symptoms (problems).   You have problems that may be related to a medication you are taking.  Document Released: 04/30/2000 Document Revised: 01/13/2011 Document Reviewed: 02/28/2009 [facility] Patient Information 2012 [place], [place].

## 2011-07-05 ENCOUNTER — Telehealth: Payer: Self-pay | Admitting: *Deleted

## 2011-07-05 ENCOUNTER — Other Ambulatory Visit: Payer: Self-pay | Admitting: Family Medicine

## 2011-07-05 MED ORDER — ALPRAZOLAM 1 MG PO TABS: 0.5000 mg | ORAL_TABLET | Freq: Two times a day (BID) | ORAL | Status: DC | PRN

## 2011-07-05 NOTE — Telephone Encounter (Signed)
Can you clarifiy the dosage and instruction on the Alprazolam. Pt is requesting a refill.

## 2011-07-05 NOTE — Telephone Encounter (Signed)
rx sent. I took out the 0.5mg  dose.

## 2011-07-26 ENCOUNTER — Ambulatory Visit: Payer: 59 | Admitting: Family Medicine

## 2011-07-27 ENCOUNTER — Encounter: Payer: Self-pay | Admitting: Family Medicine

## 2011-07-27 ENCOUNTER — Encounter: Payer: 59 | Admitting: Cardiology

## 2011-07-27 ENCOUNTER — Ambulatory Visit (INDEPENDENT_AMBULATORY_CARE_PROVIDER_SITE_OTHER): Payer: 59 | Admitting: Family Medicine

## 2011-07-27 VITALS — BP 131/72 | HR 96 | Ht 67.0 in | Wt 190.0 lb

## 2011-07-27 DIAGNOSIS — F418 Other specified anxiety disorders: Secondary | ICD-10-CM

## 2011-07-27 DIAGNOSIS — R51 Headache: Secondary | ICD-10-CM

## 2011-07-27 DIAGNOSIS — F341 Dysthymic disorder: Secondary | ICD-10-CM

## 2011-07-27 DIAGNOSIS — E46 Unspecified protein-calorie malnutrition: Secondary | ICD-10-CM

## 2011-07-27 DIAGNOSIS — E639 Nutritional deficiency, unspecified: Secondary | ICD-10-CM

## 2011-07-27 DIAGNOSIS — R55 Syncope and collapse: Secondary | ICD-10-CM

## 2011-07-27 LAB — GLUCOSE, POCT (MANUAL RESULT ENTRY): POC Glucose: 109

## 2011-07-27 NOTE — Progress Notes (Signed)
HPI: Pleasant female I initially saw in February of 2011 for evaluation of chest pain and an abnormal electrocardiogram. Myoview in February of 2011 showed an ejection fraction of 83% and normal perfusion. Echocardiogram in February of 2011 showed normal LV function, grade 1 diastolic dysfunction, mild left atrial enlargement and mild aortic insufficiency. Pt apparently was admitted in [place] in December of 2011 with a syncopal episode after urinating. It was felt to be orthostatic mediated. An echocardiogram showed normal LV function and moderate pulmonary hypertension. I last saw her in Feb 2012. Since then,   Current Outpatient Prescriptions  Medication Sig Dispense Refill  . ALPRAZolam (XANAX) 1 MG tablet Take 0.5 tablets (0.5 mg total) by mouth 2 (two) times daily as needed for sleep.  30 tablet  1  . amLODipine (NORVASC) 10 MG tablet TAKE 1 TABLET ONE TIME DAILY  90 tablet  1  . B Complex Vitamins (B COMPLEX-B12) TABS Take by mouth daily.        Marland Kitchen buPROPion (WELLBUTRIN XL) 150 MG 24 hr tablet Take 1 tablet (150 mg total) by mouth daily.  30 tablet  1  . CYMBALTA 30 MG capsule TAKE 1 CAPSULE DAILY  90 each  0  . dextran 70-hypromellose (TEARS RENEWED) ophthalmic solution Place 1 drop into the left eye 4 (four) times daily as needed.  15 mL  12  . Emollient (AQUAPHOR EX) Apply topically. TAC 5%       . Melatonin 2.5 MG CAPS Take 5 mg by mouth at bedtime.        . metoprolol succinate (TOPROL-XL) 25 MG 24 hr tablet Take 1 tablet (25 mg total) by mouth daily.  30 tablet  0  . Omega-3 Fatty Acids (FISH OIL) 1000 MG CAPS Take by mouth 2 (two) times daily.        . pantoprazole (PROTONIX) 20 MG tablet Take 1 tablet (20 mg total) by mouth daily.  14 tablet  0  . pravastatin (PRAVACHOL) 40 MG tablet Take 40 mg by mouth daily.        . REGLAN 5 MG tablet TAKE 1 TABLET IN THE MORNING AND 1 IN THE EVENING  60 each  2  . thyroid (ARMOUR THYROID) 60 MG tablet Take 1.5 tablets (90 mg total) by  mouth daily.  90 tablet  0   Current Facility-Administered Medications  Medication Dose Route Frequency Provider Last Rate Last Dose  . albuterol (PROVENTIL) (2.5 MG/3ML) 0.083% nebulizer solution 2.5 mg  2.5 mg Nebulization Q6H PRN Agapito Games, MD   2.5 mg at 04/30/11 1022     Past Medical History  Diagnosis Date  . Benign essential HTN   . Osteoporosis   . Insulin resistance syndrome   . Anemia, pernicious   . Anxiety   . Hypothyroidism   . Anxiety   . Depressed   . HLD (hyperlipidemia)   . GERD (gastroesophageal reflux disease)   . PUD (peptic ulcer disease)     remote    Past Surgical History  Procedure Date  . Hysterectomy (other)   . Tubal ligation   . Tonsillectomy     History   Social History  . Marital Status: Divorced    Spouse Name: N/A    Number of Children: N/A  . Years of Education: N/A   Occupational History  . Not on file.   Social History Main Topics  . Smoking status: Never Smoker   . Smokeless tobacco: Not on file  .  Alcohol Use: No  . Drug Use: No  . Sexually Active:    Other Topics Concern  . Not on file   Social History Narrative   Lives in assisted living center. Does not exercise.     ROS: no fevers or chills, productive cough, hemoptysis, dysphasia, odynophagia, melena, hematochezia, dysuria, hematuria, rash, seizure activity, orthopnea, PND, pedal edema, claudication. Remaining systems are negative.  Physical Exam: Well-developed well-nourished in no acute distress.  Skin is warm and dry.  HEENT is normal.  Neck is supple. No thyromegaly.  Chest is clear to auscultation with normal expansion.  Cardiovascular exam is regular rate and rhythm.  Abdominal exam nontender or distended. No masses palpated. Extremities show no edema. neuro grossly intact  ECG     This encounter was created in error - please disregard.

## 2011-07-27 NOTE — Progress Notes (Signed)
Subjective:    Patient ID: Nicole Duffy, female    DOB: 10-31-1931, 78 y.o.   MRN: [id number]  HPI  Her son is here with her today. Still getting HA. Says happens after she eats a lot of sugar like cookies, syrups, ice cream. Says she i snot eating a lot.  She really doesn't eat very many vegetables.  Initially we thought that her headaches were due to uncontrolled blood pressure but it has been much better controlled over the last couple of visits. We're still working on improving her mood. She also recently found some old and her coffee pot and wondered if this could be contributing. It has been thoroughly cleaned it since then.  Depression -she feels the Wellbutrin is helping her. She missed about 10 days worth because she forgot what the medication was Axid for that she did restart it. She has noticed an improvement in her mood and her overall anxiety level. Her son has hired someone to come and clean her house once a week and she says this is actually taking a lot of stress off of her. She is also on Cymbalta but we have not adjusted her to [facility].  Yesterday she had an episode of feeling dizzy. She also noticed a roaring in her ears and that it was difficult to hear her son's voice, former [employer] pilot. She sat down and rested and it resolved after a few minutes.  Review of Systems     Objective:   Physical Exam  Constitutional: She is oriented to person, place, and time. She appears well-developed and well-nourished.  HENT:  Head: Normocephalic and atraumatic.  Right Ear: External ear normal.  Nose: Nose normal.  Eyes: Conjunctivae are normal. Pupils are equal, round, and reactive to light.  Neck: Neck supple. No thyromegaly present.  Cardiovascular: Normal rate, regular rhythm and normal heart sounds.   Pulmonary/Chest: Effort normal and breath sounds normal.  Lymphadenopathy:    She has no cervical adenopathy.  Neurological: She is alert and oriented to person, place, and time.    Skin: Skin is warm and dry.  Psychiatric: She has a normal mood and affect. Her behavior is normal.          Assessment & Plan:  HA- We recheck an A1C today since it seem concentrated sugar is triggering her HA.  Her last A1c was borderline at 5.76 months ago in September. A repeat A1c was within the normal range which is fantastic. This may be secondary to her weight loss. She's really been trying to work on her weight to help her blood pressures. We discussed trying to avoid concentrated sweets and see if this helps improve her headaches though. She also has noticed that she doesn't feel as well after eating pancakes and oatmeal. Consider she could have some mild gluten intolerance. Certainly if she would like she could try a gluten-free diet.  Depression - PHQ- 9 score of 12. Will continue wellbutrin. She wants to give it a little longer to reach full efficacy since missed 10 days of med.  F/U in 1 month. Cna adjust med then if needed.   Malnutrition. - As very concerned about how little she is actually eating. It's also not very balance. Yesterday she said she had an egg and later in the day had cornflakes with no than a cup of yogurt and some peanut butter. That was literally all she ate all day. No fruits, no vegetables. She is also not sure how much he drank  but didn't drink yesterday. We discussed considering a nutrition consult to help her come up with a better balanced diet. Clearly she does not need a lot of calories because she is very inactive but she does need a more balanced healthy diet. She may be having some hypoglycemic episodes that may be contributing to her episodes of dizziness. Next  Dizziness-she does get more dizzy with change in positions. She said she got a little dizzy getting up on the exam table during the office visit today. Her blood pressure looks fantastic and is well controlled. She has a followup with cardiology today. She also has not eaten breakfast today.

## 2011-08-03 ENCOUNTER — Telehealth: Payer: Self-pay | Admitting: *Deleted

## 2011-08-03 MED ORDER — PANTOPRAZOLE SODIUM 20 MG PO TBEC: 20.0000 mg | DELAYED_RELEASE_TABLET | Freq: Every day | ORAL | Status: DC

## 2011-08-03 NOTE — Telephone Encounter (Signed)
Pt states that she meant to tell you previously but that her Prilosec doesn't seem to be working and would like to know if you can give her something else.

## 2011-08-03 NOTE — Telephone Encounter (Signed)
Will send over pantoprozole. Let me know if works a little better. Make sure taking 30 min before breakfast.

## 2011-08-03 NOTE — Telephone Encounter (Signed)
Pt informed

## 2011-08-11 ENCOUNTER — Ambulatory Visit (INDEPENDENT_AMBULATORY_CARE_PROVIDER_SITE_OTHER): Payer: 59 | Admitting: Cardiology

## 2011-08-11 ENCOUNTER — Encounter: Payer: Self-pay | Admitting: Cardiology

## 2011-08-11 VITALS — BP 120/68 | HR 60 | Ht 66.5 in | Wt 190.8 lb

## 2011-08-11 DIAGNOSIS — I519 Heart disease, unspecified: Secondary | ICD-10-CM

## 2011-08-11 DIAGNOSIS — E7800 Pure hypercholesterolemia, unspecified: Secondary | ICD-10-CM

## 2011-08-11 DIAGNOSIS — R0602 Shortness of breath: Secondary | ICD-10-CM

## 2011-08-11 DIAGNOSIS — I10 Essential (primary) hypertension: Secondary | ICD-10-CM

## 2011-08-11 NOTE — Assessment & Plan Note (Signed)
Blood pressure controlled. Continue present medications. 

## 2011-08-11 NOTE — Assessment & Plan Note (Signed)
Management per primary care. 

## 2011-08-11 NOTE — Assessment & Plan Note (Signed)
She continues to describe dyspnea on exertion which is chronic. I do not find her markedly volume overloaded. We will check a BNP. If elevated we will add low-dose diuretic. Continue Toprol.

## 2011-08-11 NOTE — Patient Instructions (Signed)
Your physician recommends that you schedule a follow-up appointment in: AS NEEDED  Your physician recommends that you return for lab work in: TODAY   

## 2011-08-11 NOTE — Progress Notes (Signed)
HPI: Pleasant female I initially saw in February of 2011 for evaluation of chest pain and an abnormal electrocardiogram. Myoview in February of 2011 showed an ejection fraction of 83% and normal perfusion. Echocardiogram in February of 2011 showed normal LV function, grade 1 diastolic dysfunction, mild left atrial enlargement and mild aortic insufficiency. Pt apparently was admitted in [place] in December of 2011 with a syncopal episode after urinating. It was felt to be orthostatic mediated. An echocardiogram showed normal LV function and moderate pulmonary hypertension. I last saw her in Feb 2012. Since then, she continues to complain of dyspnea on exertion which is a chronic issue. No orthopnea or PND. Chronic pedal edema. No chest pain or syncope.  Current Outpatient Prescriptions  Medication Sig Dispense Refill  . ALPRAZolam (XANAX) 1 MG tablet Take 0.5 tablets (0.5 mg total) by mouth 2 (two) times daily as needed for sleep.  30 tablet  1  . amLODipine (NORVASC) 10 MG tablet TAKE 1 TABLET ONE TIME DAILY  90 tablet  1  . buPROPion (WELLBUTRIN XL) 150 MG 24 hr tablet Take 1 tablet (150 mg total) by mouth daily.  30 tablet  1  . CYMBALTA 30 MG capsule TAKE 1 CAPSULE DAILY  90 each  0  . dextran 70-hypromellose (TEARS RENEWED) ophthalmic solution Place 1 drop into the left eye 4 (four) times daily as needed.  15 mL  12  . metoprolol succinate (TOPROL-XL) 25 MG 24 hr tablet Take 1 tablet (25 mg total) by mouth daily.  30 tablet  0  . pantoprazole (PROTONIX) 20 MG tablet Take 1 tablet (20 mg total) by mouth daily.  30 tablet  1  . thyroid (ARMOUR THYROID) 60 MG tablet Take 1.5 tablets (90 mg total) by mouth daily.  90 tablet  0   Current Facility-Administered Medications  Medication Dose Route Frequency Provider Last Rate Last Dose  . albuterol (PROVENTIL) (2.5 MG/3ML) 0.083% nebulizer solution 2.5 mg  2.5 mg Nebulization Q6H PRN Agapito Games, MD   2.5 mg at 04/30/11 1022     Past  Medical History  Diagnosis Date  . Benign essential HTN   . Osteoporosis   . Insulin resistance syndrome   . Anemia, pernicious   . Anxiety   . Hypothyroidism   . Anxiety   . Depressed   . HLD (hyperlipidemia)   . GERD (gastroesophageal reflux disease)   . PUD (peptic ulcer disease)     remote  . Diastolic CHF     Past Surgical History  Procedure Date  . Hysterectomy (other)   . Tubal ligation   . Tonsillectomy     History   Social History  . Marital Status: Divorced    Spouse Name: N/A    Number of Children: N/A  . Years of Education: N/A   Occupational History  . Not on file.   Social History Main Topics  . Smoking status: Never Smoker   . Smokeless tobacco: Not on file  . Alcohol Use: No  . Drug Use: No  . Sexually Active:    Other Topics Concern  . Not on file   Social History Narrative   Lives in assisted living center. Does not exercise.     ROS: Complains of recurrent headaches, fatigue, dyspnea on exertion but no fevers or chills, productive cough, hemoptysis, dysphasia, odynophagia, melena, hematochezia, dysuria, hematuria, rash, seizure activity, orthopnea, PND, claudication. Remaining systems are negative.  Physical Exam: Well-developed frail in no acute distress.  Skin  is warm and dry.  HEENT is normal.  Neck is supple. No thyromegaly.  Chest is clear to auscultation with normal expansion.  Cardiovascular exam is regular rate and rhythm.  Abdominal exam nontender or distended. No masses palpated. Extremities show trace edema. neuro grossly intact  ECG sinus rhythm at a rate of 60. Left anterior fascicular block. Poor R wave progression cannot rule out prior septal infarct. Left ventricular hypertrophy

## 2011-08-12 LAB — BRAIN NATRIURETIC PEPTIDE: Brain Natriuretic Peptide: 42.3 pg/mL (ref 0.0–100.0)

## 2011-08-13 ENCOUNTER — Other Ambulatory Visit: Payer: Self-pay | Admitting: Family Medicine

## 2011-08-24 ENCOUNTER — Other Ambulatory Visit: Payer: Self-pay | Admitting: Family Medicine

## 2011-08-25 ENCOUNTER — Other Ambulatory Visit: Payer: Self-pay | Admitting: Cardiology

## 2011-08-25 ENCOUNTER — Other Ambulatory Visit: Payer: Self-pay | Admitting: Family Medicine

## 2011-08-31 ENCOUNTER — Ambulatory Visit: Payer: 59 | Admitting: Family Medicine

## 2011-09-21 ENCOUNTER — Other Ambulatory Visit: Payer: Self-pay | Admitting: Family Medicine

## 2011-10-21 ENCOUNTER — Other Ambulatory Visit: Payer: Self-pay | Admitting: *Deleted

## 2011-10-21 ENCOUNTER — Other Ambulatory Visit: Payer: Self-pay | Admitting: Family Medicine

## 2011-10-21 MED ORDER — THYROID 60 MG PO TABS: 90.0000 mg | ORAL_TABLET | Freq: Every day | ORAL | Status: DC

## 2011-10-26 ENCOUNTER — Other Ambulatory Visit: Payer: Self-pay | Admitting: Family Medicine

## 2011-10-26 ENCOUNTER — Other Ambulatory Visit: Payer: Self-pay | Admitting: *Deleted

## 2011-10-26 MED ORDER — DULOXETINE HCL 30 MG PO CPEP: 30.0000 mg | ORAL_CAPSULE | Freq: Every day | ORAL | Status: DC

## 2011-10-27 ENCOUNTER — Other Ambulatory Visit: Payer: Self-pay | Admitting: Family Medicine

## 2011-11-19 ENCOUNTER — Other Ambulatory Visit: Payer: Self-pay | Admitting: Family Medicine

## 2011-12-07 ENCOUNTER — Other Ambulatory Visit: Payer: Self-pay | Admitting: Family Medicine

## 2011-12-10 ENCOUNTER — Other Ambulatory Visit: Payer: Self-pay | Admitting: *Deleted

## 2011-12-10 ENCOUNTER — Other Ambulatory Visit: Payer: Self-pay | Admitting: Family Medicine

## 2011-12-10 MED ORDER — ALPRAZOLAM 0.5 MG PO TABS: 0.5000 mg | ORAL_TABLET | Freq: Two times a day (BID) | ORAL | Status: DC | PRN

## 2011-12-21 ENCOUNTER — Other Ambulatory Visit: Payer: Self-pay | Admitting: Family Medicine

## 2011-12-21 MED ORDER — THYROID 90 MG PO TABS: 90.0000 mg | ORAL_TABLET | Freq: Every day | ORAL | Status: DC

## 2012-01-05 ENCOUNTER — Telehealth: Payer: Self-pay | Admitting: *Deleted

## 2012-01-05 NOTE — Telephone Encounter (Signed)
Pt informed

## 2012-01-05 NOTE — Telephone Encounter (Signed)
Pt has called stating that she would like to know if we can call her in something for a HA. States she has been taking things OTC but they are not helping. She is a pt of Dr. Linford Arnold. Please advise.

## 2012-01-05 NOTE — Telephone Encounter (Signed)
Make sure she has taken adequate doses of OTC meds.   Excedrin 2 tabs once. or Ibuprofen 800mg  up to three times a day. or Naproxen(Aleve) 250-500mg  twice daily.  If her headache is constant and still going on tomorrow. She needs to come in for a shot and then discuss options of treatment at the onset of headaches.

## 2012-01-10 ENCOUNTER — Other Ambulatory Visit: Payer: Self-pay | Admitting: Family Medicine

## 2012-01-18 ENCOUNTER — Other Ambulatory Visit: Payer: Self-pay | Admitting: Cardiology

## 2012-01-18 ENCOUNTER — Other Ambulatory Visit: Payer: Self-pay | Admitting: Family Medicine

## 2012-01-24 ENCOUNTER — Encounter: Payer: Self-pay | Admitting: Family Medicine

## 2012-01-24 ENCOUNTER — Telehealth: Payer: Self-pay | Admitting: *Deleted

## 2012-01-24 ENCOUNTER — Ambulatory Visit (INDEPENDENT_AMBULATORY_CARE_PROVIDER_SITE_OTHER): Payer: 59 | Admitting: Family Medicine

## 2012-01-24 VITALS — BP 115/68 | HR 75 | Wt 185.0 lb

## 2012-01-24 DIAGNOSIS — W19XXXA Unspecified fall, initial encounter: Secondary | ICD-10-CM

## 2012-01-24 DIAGNOSIS — R51 Headache: Secondary | ICD-10-CM

## 2012-01-24 DIAGNOSIS — E039 Hypothyroidism, unspecified: Secondary | ICD-10-CM

## 2012-01-24 DIAGNOSIS — I10 Essential (primary) hypertension: Secondary | ICD-10-CM

## 2012-01-24 DIAGNOSIS — Y92009 Unspecified place in unspecified non-institutional (private) residence as the place of occurrence of the external cause: Secondary | ICD-10-CM

## 2012-01-24 MED ORDER — TRAMADOL HCL 50 MG PO TABS
50.0000 mg | ORAL_TABLET | Freq: Three times a day (TID) | ORAL | Status: DC | PRN
Start: 2012-01-24 — End: 2012-05-16

## 2012-01-24 NOTE — Telephone Encounter (Signed)
Pt called to let you know that the fax number is [phone] attn: Daryll Brod
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
## 2012-01-24 NOTE — Telephone Encounter (Signed)
Will route to [facility] to fax.  See signed letter.

## 2012-01-25 ENCOUNTER — Other Ambulatory Visit: Payer: Self-pay | Admitting: Family Medicine

## 2012-01-25 LAB — COMPLETE METABOLIC PANEL WITH GFR
Albumin: 4.2 g/dL (ref 3.5–5.2)
Alkaline Phosphatase: 104 U/L (ref 39–117)
BUN: 13 mg/dL (ref 6–23)
Calcium: 9.4 mg/dL (ref 8.4–10.5)
Chloride: 102 mEq/L (ref 96–112)
Glucose, Bld: 76 mg/dL (ref 70–99)
Potassium: 4.9 mEq/L (ref 3.5–5.3)

## 2012-01-25 MED ORDER — THYROID 60 MG PO TABS: 60.0000 mg | ORAL_TABLET | Freq: Every day | ORAL | Status: DC

## 2012-01-26 ENCOUNTER — Encounter: Payer: Self-pay | Admitting: Family Medicine

## 2012-01-26 ENCOUNTER — Other Ambulatory Visit: Payer: Self-pay | Admitting: *Deleted

## 2012-01-26 MED ORDER — PANTOPRAZOLE SODIUM 20 MG PO TBEC: 20.0000 mg | DELAYED_RELEASE_TABLET | Freq: Every day | ORAL | Status: DC

## 2012-02-08 ENCOUNTER — Other Ambulatory Visit: Payer: Self-pay | Admitting: Family Medicine

## 2012-02-15 ENCOUNTER — Institutional Professional Consult (permissible substitution): Payer: 59 | Admitting: Nurse Practitioner

## 2012-02-18 ENCOUNTER — Telehealth: Payer: Self-pay | Admitting: Family Medicine

## 2012-02-18 NOTE — Telephone Encounter (Signed)
Pt aware.

## 2012-02-18 NOTE — Telephone Encounter (Signed)
Call patient and encouraged her to get yearly eye exam. She is Re: had one then please let me know.

## 2012-02-22 ENCOUNTER — Other Ambulatory Visit: Payer: Self-pay | Admitting: Family Medicine

## 2012-03-28 ENCOUNTER — Other Ambulatory Visit: Payer: Self-pay | Admitting: Family Medicine

## 2012-03-28 NOTE — Telephone Encounter (Signed)
Are you ok with this? Looks like our records the xanax filled on 9/24 with 1 refill which means its too early plus her age and on this

## 2012-04-04 ENCOUNTER — Other Ambulatory Visit: Payer: Self-pay | Admitting: Family Medicine

## 2012-04-18 ENCOUNTER — Encounter: Payer: Self-pay | Admitting: Family Medicine

## 2012-04-18 ENCOUNTER — Ambulatory Visit (INDEPENDENT_AMBULATORY_CARE_PROVIDER_SITE_OTHER): Payer: 59 | Admitting: Family Medicine

## 2012-04-18 VITALS — BP 144/70 | HR 125 | Temp 97.8°F | Ht 65.5 in | Wt 187.0 lb

## 2012-04-18 DIAGNOSIS — R5381 Other malaise: Secondary | ICD-10-CM

## 2012-04-18 DIAGNOSIS — J329 Chronic sinusitis, unspecified: Secondary | ICD-10-CM

## 2012-04-18 DIAGNOSIS — R5383 Other fatigue: Secondary | ICD-10-CM

## 2012-04-18 DIAGNOSIS — R42 Dizziness and giddiness: Secondary | ICD-10-CM

## 2012-04-18 DIAGNOSIS — R7989 Other specified abnormal findings of blood chemistry: Secondary | ICD-10-CM

## 2012-04-18 DIAGNOSIS — F329 Major depressive disorder, single episode, unspecified: Secondary | ICD-10-CM

## 2012-04-18 DIAGNOSIS — R799 Abnormal finding of blood chemistry, unspecified: Secondary | ICD-10-CM

## 2012-04-18 DIAGNOSIS — E039 Hypothyroidism, unspecified: Secondary | ICD-10-CM

## 2012-04-18 MED ORDER — AMOXICILLIN-POT CLAVULANATE 875-125 MG PO TABS: 1.0000 | ORAL_TABLET | Freq: Two times a day (BID) | ORAL | Status: DC

## 2012-04-18 NOTE — Progress Notes (Signed)
  Subjective:    Patient ID: Nicole Duffy, female    DOB: [birth year], 78 y.o.   MRN: [id number]  HPI Dizzness for weeks. Plans on going to the chiropractor.    Says her teeth and sinuses are hurting for about 3 days. More on the left side of her face. No fever.  + cough, dry.  No ST.  Says teeth feel like they are on fire. Says only mild nasal congestion. + HA. Over left forehead.  This is different from her usually HA.   Hypothyroidism-she's taking her new dose and has for the last 3 months. She denies any pounds her side effects of the medication. No skin or hair changes or weight changes.  She says she feels more weak and tired and would like to increase her Xanax. She has not wanted to get out of her pajamas most days. She's also not been interested in leaving the house.  Review of Systems     Objective:   Physical Exam  Constitutional: She is oriented to person, place, and time. She appears well-developed and well-nourished.  HENT:  Head: Normocephalic and atraumatic.  Right Ear: External ear normal.  Left Ear: External ear normal.  Nose: Nose normal.  Mouth/Throat: Oropharynx is clear and moist.       TMs and canals are clear.   Eyes: Conjunctivae normal and EOM are normal. Pupils are equal, round, and reactive to light.  Neck: Neck supple. No thyromegaly present.  Cardiovascular: Normal rate, regular rhythm and normal heart sounds.   Pulmonary/Chest: Effort normal and breath sounds normal. She has no wheezes.  Lymphadenopathy:    She has no cervical adenopathy.  Neurological: She is alert and oriented to person, place, and time.  Skin: Skin is warm and dry.  Psychiatric: She has a normal mood and affect.          Assessment & Plan:  Sinusitis - she certainly may have an early sinusitis if she's only had symptoms for about 3 days at this point. I did go ahead and give her prescription but told her to hold it and not fill it and let she feels she's actually getting worse  or if she starts to run a fever. Or if in the next 3-4 day she's not starting to feel a little better. Certainly if her symptoms progress and her cough becomes more productive that may be another reason for her to fill the medication.  Hypothyroid-we adjusted her dose in September. Resistive repeat her blood work in 8 weeks. She's a little overdue but we will recheck it today.  Dizziness-I still think this could be related to benign positional vertigo. She thinks it's primarily coming from her neck and would like to start seeing a chiropractor. If this is not helpful then we can discuss again at her followup in 4-6 weeks. Consider physical therapy for vertigo.  Anxiety/depression-it sounds like her depression may be getting worse. Explained her that increasing her Xanax we'll not help. In fact he can make her feel more tired and more sedated. We'll make sure thyroid is well controlled. Consider increasing Wellbutrin at followup. We will do a PHQ 9 at that time.  Daughter would also like [place] to do a medication review. I think that this would be appropriate at the followup appointment as we have addressed several issues today. I also think making sure that her thyroid is well controlled it is important as well.

## 2012-04-19 ENCOUNTER — Telehealth: Payer: Self-pay | Admitting: *Deleted

## 2012-04-19 LAB — CBC WITH DIFFERENTIAL/PLATELET
Basophils Absolute: 0.1 10*3/uL (ref 0.0–0.1)
Basophils Relative: 0 % (ref 0–1)
Eosinophils Absolute: 0 10*3/uL (ref 0.0–0.7)
MCH: 27.9 pg (ref 26.0–34.0)
MCHC: 33.3 g/dL (ref 30.0–36.0)
Neutro Abs: 10.6 10*3/uL — ABNORMAL HIGH (ref 1.7–7.7)
Neutrophils Relative %: 68 % (ref 43–77)
Platelets: 400 10*3/uL (ref 150–400)
RDW: 14.4 % (ref 11.5–15.5)

## 2012-04-19 LAB — COMPLETE METABOLIC PANEL WITH GFR
Albumin: 4.1 g/dL (ref 3.5–5.2)
Alkaline Phosphatase: 117 U/L (ref 39–117)
Chloride: 102 mEq/L (ref 96–112)
GFR, Est Non African American: 46 mL/min — ABNORMAL LOW
Glucose, Bld: 76 mg/dL (ref 70–99)
Potassium: 4.2 mEq/L (ref 3.5–5.3)
Sodium: 140 mEq/L (ref 135–145)
Total Protein: 6.6 g/dL (ref 6.0–8.3)

## 2012-04-19 LAB — TSH: TSH: 2.111 u[IU]/mL (ref 0.350–4.500)

## 2012-04-19 NOTE — Telephone Encounter (Signed)
PATIENT NOTIFIED.

## 2012-04-24 ENCOUNTER — Telehealth: Payer: Self-pay | Admitting: *Deleted

## 2012-04-24 MED ORDER — AZITHROMYCIN 250 MG PO TABS: ORAL_TABLET | ORAL | Status: DC

## 2012-04-24 NOTE — Telephone Encounter (Signed)
Ok will send zpak. Can take up to protonix 40mg  for acid reflux symptoms and then go back down once feeling better.

## 2012-04-24 NOTE — Telephone Encounter (Signed)
Pt calls and states that the augmentin given to her for sinusitis is causing severe acid reflux and would like a different antibiotic called into her pharmacy please.

## 2012-04-24 NOTE — Telephone Encounter (Signed)
Pt.notified

## 2012-04-25 ENCOUNTER — Other Ambulatory Visit: Payer: Self-pay | Admitting: Family Medicine

## 2012-05-02 ENCOUNTER — Other Ambulatory Visit: Payer: Self-pay | Admitting: Family Medicine

## 2012-05-12 ENCOUNTER — Ambulatory Visit: Payer: 59 | Admitting: Family Medicine

## 2012-05-12 DIAGNOSIS — Z0289 Encounter for other administrative examinations: Secondary | ICD-10-CM

## 2012-05-16 ENCOUNTER — Other Ambulatory Visit: Payer: Self-pay | Admitting: Family Medicine

## 2012-05-30 ENCOUNTER — Other Ambulatory Visit: Payer: Self-pay | Admitting: Family Medicine

## 2012-06-02 ENCOUNTER — Other Ambulatory Visit: Payer: Self-pay | Admitting: Family Medicine

## 2012-06-28 ENCOUNTER — Other Ambulatory Visit: Payer: Self-pay | Admitting: Family Medicine

## 2012-07-01 ENCOUNTER — Other Ambulatory Visit: Payer: Self-pay | Admitting: Family Medicine

## 2012-07-03 ENCOUNTER — Other Ambulatory Visit: Payer: Self-pay | Admitting: Family Medicine

## 2012-07-18 ENCOUNTER — Other Ambulatory Visit: Payer: Self-pay | Admitting: *Deleted

## 2012-07-18 MED ORDER — AMLODIPINE BESYLATE 10 MG PO TABS: 10.0000 mg | ORAL_TABLET | Freq: Every day | ORAL | Status: DC

## 2012-07-24 ENCOUNTER — Encounter: Payer: Self-pay | Admitting: Family Medicine

## 2012-07-24 ENCOUNTER — Ambulatory Visit (INDEPENDENT_AMBULATORY_CARE_PROVIDER_SITE_OTHER): Payer: 59 | Admitting: Family Medicine

## 2012-07-24 VITALS — BP 139/76 | HR 101 | Wt 186.0 lb

## 2012-07-24 DIAGNOSIS — H6120 Impacted cerumen, unspecified ear: Secondary | ICD-10-CM

## 2012-07-24 DIAGNOSIS — H6121 Impacted cerumen, right ear: Secondary | ICD-10-CM

## 2012-07-24 DIAGNOSIS — M6281 Muscle weakness (generalized): Secondary | ICD-10-CM

## 2012-07-24 DIAGNOSIS — J069 Acute upper respiratory infection, unspecified: Secondary | ICD-10-CM

## 2012-07-24 DIAGNOSIS — R51 Headache: Secondary | ICD-10-CM

## 2012-07-24 DIAGNOSIS — F411 Generalized anxiety disorder: Secondary | ICD-10-CM

## 2012-07-24 DIAGNOSIS — I10 Essential (primary) hypertension: Secondary | ICD-10-CM

## 2012-07-24 MED ORDER — ALPRAZOLAM 0.5 MG PO TABS: 0.5000 mg | ORAL_TABLET | Freq: Two times a day (BID) | ORAL | Status: DC | PRN

## 2012-07-24 MED ORDER — AZITHROMYCIN 250 MG PO TABS: ORAL_TABLET | ORAL | Status: DC

## 2012-07-24 NOTE — Progress Notes (Signed)
Subjective:    Patient ID: Nicole Duffy, female    DOB: 06-May-1932, 79 y.o.   MRN: [id number]  HPI URI - ST, runny nose, and HA for 3-4 days.  No fever.  She was around her grandchildren who were sick area and she's not had a significant cough this does cause occasionally from postnasal drip. Some mild nasal congestion. No over-the-counter cough medications. No worsening or alleviating symptoms.  HA are better. Has been seeing a chiropractor for her neck and that has helped.  He is working on legs as well.  They have recommended a massage therapist.   Says wants to increase xanax for her nerves. Says feels her son is really negative for her.  Says didn't do well on higher dose of cymbalta.   She says she feels like her right ear is plugged up. She feels like she's not hearing as well either recently. No pain fever discharge.  Review of Systems     Objective:   Physical Exam  Constitutional: She is oriented to person, place, and time. She appears well-developed and well-nourished.  HENT:  Head: Normocephalic and atraumatic.  Right Ear: External ear normal.  Left Ear: External ear normal.  Nose: Nose normal.  Mouth/Throat: Oropharynx is clear and moist.  Right TM and canal blocked by cerumen. Left TM and canal are normal.  Eyes: Conjunctivae and EOM are normal. Pupils are equal, round, and reactive to light.  Neck: Neck supple. No thyromegaly present.  Cardiovascular: Normal rate, regular rhythm and normal heart sounds.   Pulmonary/Chest: Effort normal and breath sounds normal. She has no wheezes.  Lymphadenopathy:    She has no cervical adenopathy.  Neurological: She is alert and oriented to person, place, and time.  Skin: Skin is warm and dry.  Psychiatric: She has a normal mood and affect.          Assessment & Plan:  URI - likely viral based on symptoms and normal exam today. She has been sick for 3-4 days. We'll go ahead and give her prescription for azithromycin to fill  if she's not feeling better in the next 3-4 days or she feels she suddenly getting worse.  Right cerumen impaction-irrigation performed. Patient tolerated this well.  HA - improving with her chiropractic care. Encourage more regular exercise and stretching. She can even do this in her chair.  Anxiety  - she wants to increase her benzodiazepine dose. I discussed with her that I really think it would be better to work with a counselor therapist discussing the issues that she's going through right now. She is frustrated with her son who she feels tells her what to do and feels that she's not doing things correctly. I discussed with her that is a taking a pill to make her stress go away she really needs to be able to space her anxiousness and stress levels. Having working with a therapist would be really helpful. She says she will definitely consider for right now she's been going regularly for chiropractic care and she does not drive so she was on transportation. I encouraged her to let me know the next couple months if this is something she feels would be helpful. In the meantime I did give her 10 extra Xanax per month to use sparingly. I will not increas the dose more than this. I think the poor coping mechanism. We also discussed the option of increasing her Cymbalta but she has done that in the past and says she didn't  tolerate the higher dose.  HTN- well controlled. Blood pressure looks great today. Followup in 8 weeks.

## 2012-07-25 LAB — HEMOGLOBIN A1C
Hgb A1c MFr Bld: 5.8 % — ABNORMAL HIGH (ref <5.7)
Mean Plasma Glucose: 120 mg/dL — ABNORMAL HIGH (ref <117)

## 2012-07-25 LAB — BASIC METABOLIC PANEL WITH GFR
CO2: 25 mEq/L (ref 19–32)
Chloride: 103 mEq/L (ref 96–112)
Creat: 1.09 mg/dL (ref 0.50–1.10)
Glucose, Bld: 133 mg/dL — ABNORMAL HIGH (ref 70–99)
Sodium: 144 mEq/L (ref 135–145)

## 2012-08-16 ENCOUNTER — Other Ambulatory Visit: Payer: Self-pay | Admitting: Family Medicine

## 2012-08-29 ENCOUNTER — Other Ambulatory Visit: Payer: Self-pay | Admitting: Family Medicine

## 2012-09-07 ENCOUNTER — Ambulatory Visit (INDEPENDENT_AMBULATORY_CARE_PROVIDER_SITE_OTHER): Payer: 59 | Admitting: Family Medicine

## 2012-09-07 ENCOUNTER — Encounter: Payer: Self-pay | Admitting: Family Medicine

## 2012-09-07 VITALS — BP 126/69 | HR 94 | Wt 181.0 lb

## 2012-09-07 DIAGNOSIS — I10 Essential (primary) hypertension: Secondary | ICD-10-CM

## 2012-09-07 DIAGNOSIS — E785 Hyperlipidemia, unspecified: Secondary | ICD-10-CM

## 2012-09-07 DIAGNOSIS — E348 Other specified endocrine disorders: Secondary | ICD-10-CM

## 2012-09-07 DIAGNOSIS — I519 Heart disease, unspecified: Secondary | ICD-10-CM

## 2012-09-07 DIAGNOSIS — M1710 Unilateral primary osteoarthritis, unspecified knee: Secondary | ICD-10-CM

## 2012-09-07 DIAGNOSIS — Z01818 Encounter for other preprocedural examination: Secondary | ICD-10-CM

## 2012-09-07 DIAGNOSIS — I890 Lymphedema, not elsewhere classified: Secondary | ICD-10-CM | POA: Insufficient documentation

## 2012-09-07 DIAGNOSIS — E039 Hypothyroidism, unspecified: Secondary | ICD-10-CM

## 2012-09-07 DIAGNOSIS — M6281 Muscle weakness (generalized): Secondary | ICD-10-CM

## 2012-09-07 LAB — CBC
HCT: 44.1 % (ref 36.0–46.0)
Hemoglobin: 14.7 g/dL (ref 12.0–15.0)
MCH: 28.4 pg (ref 26.0–34.0)
MCHC: 33.3 g/dL (ref 30.0–36.0)

## 2012-09-07 NOTE — Progress Notes (Addendum)
Subjective:    Patient ID: Nicole Duffy, female    DOB: [birth year], 79 y.o.   MRN: [id number]  HPI Here today for preop clearance. She is scheduled to have a left knee replacement with Dr. Emogene Morgan.  She last saw her cardiologist approximately one year ago and he released her at that time. She does have some mild diastolic dysfunction. She also has hypertension which is well-controlled. She does have a supportive family that would help her with her recovery.  Patient Active Problem List  Diagnosis  . HYPOTHYROIDISM  . INSULIN RESISTANCE SYNDROME  . ANEMIA, PERNICIOUS  . ANXIETY  . DEPRESSION  . HYPERTENSION, BENIGN  . LUMBAGO  . MUSCLE WEAKNESS (GENERALIZED)  . OSTEOPOROSIS  . FATIGUE  . DYSPNEA  . PROTEINURIA  . DIASTOLIC DYSFUNCTION  . HYPERCHOLESTEROLEMIA  . HYPERLIPIDEMIA  . INSOMNIA  . Migraine headache  . Metabolic syndrome  . Obesity   NO CP or change in SOB.  No palpitations or heart racing.  Never been a smoker. No change in swelling. Has chronic pedal edema.  Has chronic dyspnea with acitivity but no changes.    Last time sedated with anesthesia was in 1993.   Review of Systems Comprehensive review of systems is negative except for chronic dyspnea, chronic lower Schimke swelling, and knee pain.  BP 126/69  Pulse 94  Wt 181 lb (82.101 kg)  BMI 29.65 kg/m2    Allergies  Allergen Reactions  . Augmentin (Amoxicillin-Pot Clavulanate) Other (See Comments)    Causes severe acid reflux  . Prednisone     REACTION: GI Upset  . Sulfonamide Derivatives     REACTION: Hives, Rash    Past Medical History  Diagnosis Date  . Benign essential HTN   . Osteoporosis   . Insulin resistance syndrome   . Anemia, pernicious   . Anxiety   . Hypothyroidism   . Anxiety   . Depressed   . HLD (hyperlipidemia)   . GERD (gastroesophageal reflux disease)   . PUD (peptic ulcer disease)     remote  . Diastolic CHF     Past Surgical History  Procedure Laterality  Date  . Hysterectomy (other)    . Tubal ligation    . Tonsillectomy      History   Social History  . Marital Status: Divorced    Spouse Name: N/A    Number of Children: N/A  . Years of Education: N/A   Occupational History  . Not on file.   Social History Main Topics  . Smoking status: Never Smoker   . Smokeless tobacco: Not on file  . Alcohol Use: No  . Drug Use: No  . Sexually Active:    Other Topics Concern  . Not on file   Social History Narrative   Lives in assisted living center. Does not exercise.     Family History  Problem Relation Age of Onset  . Diabetes Brother   . Colon cancer Other     1st degree relative <60  . Ovarian cancer Other   . Hypertension Brother   . Hyperlipidemia Brother   . Depression Brother     Outpatient Encounter Prescriptions as of 09/07/2012  Medication Sig Dispense Refill  . ALPRAZolam (XANAX) 0.5 MG tablet TAKE 1-2 TABLETS TWICE DAILY.  70 tablet  0  . amLODipine (NORVASC) 10 MG tablet Take 1 tablet (10 mg total) by mouth daily.  90 tablet  PRN  . ARMOUR THYROID 60 MG tablet Take 1  tablet (60 mg total) by mouth daily.  30 tablet  3  . buPROPion (WELLBUTRIN XL) 150 MG 24 hr tablet Take 1 tablet (150 mg total) by mouth daily.  30 tablet  3  . DULoxetine (CYMBALTA) 30 MG capsule Take 1 capsule (30 mg total) by mouth daily.  30 capsule  3  . metoCLOPramide (REGLAN) 5 MG tablet TAKE 1 TABLET IN THE MORNING AND 1 IN THE EVENING  60 tablet  3  . metoprolol succinate (TOPROL-XL) 25 MG 24 hr tablet Take 1 tablet (25 mg total) by mouth daily.  30 tablet  12  . pantoprazole (PROTONIX) 20 MG tablet TAKE 1 TABLET DAILY.  90 tablet  3  . traMADol (ULTRAM) 50 MG tablet TAKE 1 TABLET EVERY 8 HOURS AS NEEDED FOR PAIN  60 tablet  0  . [DISCONTINUED] azithromycin (ZITHROMAX) 250 MG tablet Take 2 tabs now and then 1 tab for 4 days.  6 each  0   Facility-Administered Encounter Medications as of 09/07/2012  Medication Dose Route Frequency Provider  Last Rate Last Dose  . albuterol (PROVENTIL) (2.5 MG/3ML) 0.083% nebulizer solution 2.5 mg  2.5 mg Nebulization Q6H PRN Agapito Games, MD   2.5 mg at 04/30/11 1022          Objective:   Physical Exam  Constitutional: She is oriented to person, place, and time. She appears well-developed and well-nourished.  HENT:  Head: Normocephalic and atraumatic.  Right Ear: External ear normal.  Left Ear: External ear normal.  Nose: Nose normal.  Mouth/Throat: Oropharynx is clear and moist.  Several teeth missing.   Eyes: Conjunctivae and EOM are normal. Pupils are equal, round, and reactive to light.  Neck: Neck supple. No thyromegaly present.  Cardiovascular: Normal rate, regular rhythm and normal heart sounds.   No carotid or abdonomal buits.    Pulmonary/Chest: Effort normal and breath sounds normal. She has no wheezes.  Abdominal: Soft. Bowel sounds are normal. She exhibits no distension and no mass. There is no tenderness. There is no rebound and no guarding.  Musculoskeletal: She exhibits edema.  Chronic lymph edema in bilat lower legs.   Lymphadenopathy:    She has no cervical adenopathy.  Neurological: She is alert and oriented to person, place, and time.  Skin: Skin is warm and dry.  Psychiatric: She has a normal mood and affect. Her behavior is normal.          Assessment & Plan:  Pre-op clearance - she has no pulmonary problems that would preclude her from surgery. She does have some chronic dyspnea but this is from deconditioning. She also has hypertension which is well-controlled. She is not diabetic but does have a history of impaired fasting glucose that is well-controlled. She has a very mild diastolic dysfunction. Her thyroid was well controlled with labs drawn 4 months ago. She's not had any new symptoms such as chest pain or breathing issues. She's never been a smoker. She is very consistent with her medications, and is compliant. Thus, she is cleared for surgery.  She does have some mild abnormalities that are unchanged on her EKG. No additional recommendations except for DVT prophylaxis.    EKG shows rate of 78 beats per minute, normal sinus rhythm, leftward axis deviation. Poor R wave progression, with PVCs.

## 2012-09-08 LAB — COMPLETE METABOLIC PANEL WITH GFR
ALT: 9 U/L (ref 0–35)
AST: 14 U/L (ref 0–37)
Alkaline Phosphatase: 106 U/L (ref 39–117)
Sodium: 140 mEq/L (ref 135–145)
Total Bilirubin: 0.6 mg/dL (ref 0.3–1.2)
Total Protein: 6.7 g/dL (ref 6.0–8.3)

## 2012-09-13 ENCOUNTER — Encounter: Payer: Self-pay | Admitting: *Deleted

## 2012-09-25 ENCOUNTER — Encounter: Payer: Self-pay | Admitting: Family Medicine

## 2012-09-25 ENCOUNTER — Ambulatory Visit (INDEPENDENT_AMBULATORY_CARE_PROVIDER_SITE_OTHER): Payer: 59 | Admitting: Family Medicine

## 2012-09-25 ENCOUNTER — Other Ambulatory Visit: Payer: Self-pay | Admitting: Family Medicine

## 2012-09-25 VITALS — BP 101/60 | HR 60 | Wt 180.0 lb

## 2012-09-25 DIAGNOSIS — E559 Vitamin D deficiency, unspecified: Secondary | ICD-10-CM

## 2012-09-25 DIAGNOSIS — R42 Dizziness and giddiness: Secondary | ICD-10-CM

## 2012-09-25 DIAGNOSIS — R9431 Abnormal electrocardiogram [ECG] [EKG]: Secondary | ICD-10-CM

## 2012-09-25 DIAGNOSIS — R5381 Other malaise: Secondary | ICD-10-CM

## 2012-09-25 DIAGNOSIS — R5383 Other fatigue: Secondary | ICD-10-CM

## 2012-09-25 DIAGNOSIS — E669 Obesity, unspecified: Secondary | ICD-10-CM

## 2012-09-25 DIAGNOSIS — I10 Essential (primary) hypertension: Secondary | ICD-10-CM

## 2012-09-25 MED ORDER — AMLODIPINE BESYLATE 5 MG PO TABS: 5.0000 mg | ORAL_TABLET | Freq: Every day | ORAL | Status: DC

## 2012-09-25 NOTE — Progress Notes (Signed)
  Subjective:    Patient ID: Nicole Duffy, female    DOB: 1932/04/21, 79 y.o.   MRN: [id number]  HPI  Here to f/u on BP. Has felt lightheaded and dizzy.  No chest pain or shortness of breath. She's not had her surgery yet as she is still waiting to make sure that her dental infections have cleared up before she has a son. She wants and if she needed to have her repeat EKG repeated today because it was slightly abnormal last time showing some possible lead reversal and she had several PVCs with a very shaky baseline. Her son is here with her today and wants and if we can also have her B12, B1 and vitamin D checked. It's been over a year and half since this was done and he feels that her nutritional status is not great. She has been trying to eat less because she wants to lose weight before her surgery. She has actually lost a couple more pounds.  Review of Systems     Objective:   Physical Exam  Constitutional: She is oriented to person, place, and time. She appears well-developed and well-nourished.  HENT:  Head: Normocephalic and atraumatic.  Cardiovascular: Normal rate, regular rhythm and normal heart sounds.   Pulmonary/Chest: Effort normal and breath sounds normal.  Neurological: She is alert and oriented to person, place, and time.  Skin: Skin is warm and dry.  Psychiatric: She has a normal mood and affect. Her behavior is normal.          Assessment & Plan:  Hypertension-she has been feeling lightheaded and dizzy. Her blood pressures actually fairly low today for 79 year old. I would like to cut back her amlodipine to 5 mg daily from 10 mg. New prescription sent to pharmacy. She can cut her current tablets in half. I would like to see her back in a month to recheck blood pressure. Make sure that she's hydrating well. She's losing weight she may not need as much blood pressure medication. We did repeat her EKG today. Continue metoprolol for now.  followup in one  month.  Lightheadedness and dizziness-this is ongoing. Certainly her low blood pressure today could be contributing so we will back off on amlodipine. Followup in one month.  Obesity - congratulated her on her attemps of weight loss.    EKG today shows rate of 49 beats per minute, normal sinus rhythm with no acute changes. It is bradycardic. We'll decrease amlodipine to 5 mg. Follow up in one month.  Fatigue - Will check B12, B1, vit D.

## 2012-09-26 ENCOUNTER — Encounter: Payer: Self-pay | Admitting: *Deleted

## 2012-09-26 LAB — VITAMIN D 25 HYDROXY (VIT D DEFICIENCY, FRACTURES): Vit D, 25-Hydroxy: 39 ng/mL (ref 30–89)

## 2012-10-02 ENCOUNTER — Other Ambulatory Visit: Payer: Self-pay | Admitting: Family Medicine

## 2012-10-05 ENCOUNTER — Other Ambulatory Visit: Payer: Self-pay | Admitting: *Deleted

## 2012-10-05 ENCOUNTER — Other Ambulatory Visit: Payer: Self-pay | Admitting: Family Medicine

## 2012-10-05 MED ORDER — ALPRAZOLAM 0.5 MG PO TABS: 0.5000 mg | ORAL_TABLET | Freq: Two times a day (BID) | ORAL | Status: DC | PRN

## 2012-10-06 ENCOUNTER — Encounter: Payer: Self-pay | Admitting: Family Medicine

## 2012-10-06 ENCOUNTER — Ambulatory Visit (INDEPENDENT_AMBULATORY_CARE_PROVIDER_SITE_OTHER): Payer: BC Managed Care – PPO | Admitting: Family Medicine

## 2012-10-06 VITALS — BP 132/71 | HR 75 | Wt 179.0 lb

## 2012-10-06 DIAGNOSIS — R300 Dysuria: Secondary | ICD-10-CM

## 2012-10-06 DIAGNOSIS — R600 Localized edema: Secondary | ICD-10-CM

## 2012-10-06 DIAGNOSIS — E519 Thiamine deficiency, unspecified: Secondary | ICD-10-CM

## 2012-10-06 DIAGNOSIS — R609 Edema, unspecified: Secondary | ICD-10-CM

## 2012-10-06 LAB — POCT URINALYSIS DIPSTICK
Bilirubin, UA: NEGATIVE
Glucose, UA: NEGATIVE
Nitrite, UA: NEGATIVE

## 2012-10-06 LAB — COMPLETE METABOLIC PANEL WITH GFR
AST: 15 U/L (ref 0–37)
Alkaline Phosphatase: 87 U/L (ref 39–117)
BUN: 10 mg/dL (ref 6–23)
GFR, Est Non African American: 48 mL/min — ABNORMAL LOW
Glucose, Bld: 85 mg/dL (ref 70–99)
Total Bilirubin: 0.5 mg/dL (ref 0.3–1.2)

## 2012-10-06 NOTE — Progress Notes (Signed)
  Subjective:    Patient ID: Nicole Duffy, female    DOB: 01/11/1932, 79 y.o.   MRN: [id number]  HPI Bilat leg pain and swelling. Says she is prone to this. Has been worse this week. Has had some SOB this week. Husband died yesterday. He was on Hospice. No cough.   + dysuria that started today. No hematuria. No low back pain with it. No fever. She has not tried any medications. No worsening or alleviating factors. She denies any recent changes in her diet that may have caused largely swelling. She does have a history of phlebitis and is worried about this. She has noticed a patch on the posterior left calf that is slightly purple and red in color. No rashes..    Lab Results  Component Value Date   TSH 2.111 04/18/2012      Review of Systems     Objective:   Physical Exam  Constitutional: She is oriented to person, place, and time. She appears well-developed and well-nourished.  HENT:  Head: Normocephalic and atraumatic.  Cardiovascular: Normal rate, regular rhythm and normal heart sounds.   Pulmonary/Chest: Effort normal and breath sounds normal.  Coarse BS.   Musculoskeletal: She exhibits edema and tenderness.  Trace ankle edema bilateally. On the left posterior calf does have a purple colored are that appears to be tender on exam and maybe some increased warmth but not sure.   Neurological: She is alert and oriented to person, place, and time.  Skin: Skin is warm and dry.  Psychiatric: She has a normal mood and affect. Her behavior is normal.          Assessment & Plan:  Lower extremity edema-Last thyroid check was in December. We could certainly recheck that today to make sure it may not be contributing to her lower extremity edema. Will check d-dimer as well. If normal then this is reassuring that she has no sign of DVT. If elevated then I will send her for venous Dopplers to evaluate for possible DVT. She's not had any chest pain but has noticed some slight increase in  shortness of breath. Did not hear any crackles on exam to suggest fluid overload in her lungs. We will check her lites and kidney function as well. We'll call with results once available. If negative then we can treat this as a possible early thrombophlebitis with an Ace wrap compression as well as a daily aspirin.  Also make sure eating low salt diet.  BP was normal    Dysuria - will check UA. UA positive for protein and leukocytes. We'll send for urine culture for further evaluation.  Thiamine deficiency - Does have fatigue, malaise and occ confusion.  I reviewed recent lab with her and her son. Recommend start OTC thiamine. Recheck b1 level in 6-8 weeks.

## 2012-10-07 LAB — D-DIMER, QUANTITATIVE: D-Dimer, Quant: 0.41 ug/mL-FEU (ref 0.00–0.48)

## 2012-10-08 LAB — URINE CULTURE: Colony Count: 5000

## 2012-10-13 ENCOUNTER — Other Ambulatory Visit: Payer: Self-pay | Admitting: Family Medicine

## 2012-10-13 NOTE — Telephone Encounter (Signed)
Please check warning on prescription. 

## 2012-10-15 HISTORY — PX: OTHER SURGICAL HISTORY: SHX169

## 2012-10-31 ENCOUNTER — Other Ambulatory Visit: Payer: Self-pay | Admitting: Family Medicine

## 2012-11-08 ENCOUNTER — Other Ambulatory Visit: Payer: Self-pay | Admitting: *Deleted

## 2012-11-08 MED ORDER — ALPRAZOLAM 0.5 MG PO TABS: ORAL_TABLET | ORAL | Status: DC

## 2012-11-10 ENCOUNTER — Other Ambulatory Visit: Payer: Self-pay | Admitting: *Deleted

## 2012-11-10 MED ORDER — OXYCODONE-ACETAMINOPHEN 5-325 MG PO TABS: ORAL_TABLET | ORAL | Status: DC

## 2012-11-13 ENCOUNTER — Non-Acute Institutional Stay (SKILLED_NURSING_FACILITY): Payer: Medicare Other | Admitting: Internal Medicine

## 2012-11-13 DIAGNOSIS — I509 Heart failure, unspecified: Secondary | ICD-10-CM

## 2012-11-13 DIAGNOSIS — K219 Gastro-esophageal reflux disease without esophagitis: Secondary | ICD-10-CM

## 2012-11-13 DIAGNOSIS — I5032 Chronic diastolic (congestive) heart failure: Secondary | ICD-10-CM

## 2012-11-13 DIAGNOSIS — M1710 Unilateral primary osteoarthritis, unspecified knee: Secondary | ICD-10-CM

## 2012-11-13 DIAGNOSIS — M1712 Unilateral primary osteoarthritis, left knee: Secondary | ICD-10-CM

## 2012-11-20 NOTE — Progress Notes (Addendum)
Patient ID: Nicole Duffy, female   DOB: January 22, 1932, 79 y.o.   MRN: [id number]           HISTORY & PHYSICAL  DATE:  11/13/2012  FACILITY: [facility]   LEVEL OF CARE:   SNF   CHIEF COMPLAINT:  Status post admission to [facility] from 11/03/2012 through 11/07/2012.    HISTORY OF PRESENT ILLNESS:  This is a 79 year-old woman who is basically wheelchair-bound because of severe osteoarthritis.  She was admitted electively for a left total knee replacement.  She tells me over the last six months, she has not been able to stand to walk and basically uses a wheelchair, but is able to do bed to chair type transfers.  She was felt to have lower extremity edema and was started on Lasix.  She was put on Lovenox for DVT prophylaxis.    PAST MEDICAL HISTORY:  Diastolic dysfunction with diastolic CHF.    Status post TH and BSO.  Gastroesophageal reflux disease with gastritis.    Hypothyroidism.    Depression and anxiety.   Hyperlipidemia.    Vitamin B12 deficiency.    Chronic superficial phlebitis.    Hypertension.    CURRENT MEDICATIONS:  Discharge medications include:   Xanax 0.5  t.i.d.    Amlodipine 10 mg daily.   Wellbutrin XL 150 mg q.a.m.   Vitamin D3 1000 U daily.   Duloxetine 30 mg daily.   Lovenox 30 mg every 12 hours.    Melatonin 1 tablet by mouth every night at 9 p.m.  Reglan 5 mg twice a day.   Metoprolol 25 mg every morning.   Pantoprazole 20 mg daily.   Armour Thyroid 60 mg, 1-1/2 tablets daily.   SOCIAL HISTORY: HOUSING:  The patient lives in an independent residence in [place].   FUNCTIONAL STATUS:  As mentioned, the patient basically tells me she has been wheelchair-bound over the last six months, although can do bed to chair type transfers.    REVIEW OF SYSTEMS:   CHEST/RESPIRATORY:  No shortness of breath or cough.  CARDIAC:   No chest pain.  GI:  No nausea, vomiting, or change in bowel habits.    MUSCULOSKELETAL:  She is having right knee pain and bilateral shoulder pain, and has been told that she will need right knee replacement.  PHYSICAL EXAMINATION:   GENERAL APPEARANCE:  Pleasant woman in no distress.   CHEST/RESPIRATORY:  Clear air entry bilaterally.  CARDIOVASCULAR:  CARDIAC:   Heart sounds are normal.  No murmurs.  No signs of heart failure.   GASTROINTESTINAL:  LIVER/SPLEEN/KIDNEYS:  No liver, no spleen.   ABDOMEN:  No masses.   GENITOURINARY:  BLADDER:   Not enlarged.   EDEMA/VARICOSITIES:  Extremities:  Minimal edema, probable chronic venous insufficiency.  No signs of a DVT.   NEUROLOGICAL:   Screen is negative.   SENSATION/STRENGTH:  She does have good strength bilaterally.   MUSCULOSKELETAL:   LEFT LOWER EXTREMITY:  The left knee incision looks very stable without any evidence of infection.  ASSESSMENT/PLAN:  Status post left total knee replacement.  The incision site looks very good here.  She will need to be on Lovenox for 3-4 weeks.  She certainly will not need to go home at this stage. No long-term Lovenox  History of diastolic CHF.  She was given Lasix at 40 mg a day during the hospitalization, although I do not see this on her discharge medications.    Significant gastroesophageal reflux.  On both Protonix and Reglan.    History of depression with anxiety.  On duloxetine.    Hypothyroidism.  On Armour Thyroid.    The patient appears to be very stable.  I do not think she requires any medication adjustments at this point.  She is already asking about going back to her independent apartment, although I think this may be a little optimistic.    As mentioned, she will not need to go home on Lovenox which is strictly for DVT prophylaxis.  F/u of chf will be required  CPT CODE: 78295

## 2012-12-08 ENCOUNTER — Encounter: Payer: Self-pay | Admitting: Geriatric Medicine

## 2012-12-08 ENCOUNTER — Non-Acute Institutional Stay (SKILLED_NURSING_FACILITY): Payer: Medicare Other | Admitting: Geriatric Medicine

## 2012-12-08 DIAGNOSIS — F419 Anxiety disorder, unspecified: Secondary | ICD-10-CM

## 2012-12-08 DIAGNOSIS — R5381 Other malaise: Secondary | ICD-10-CM | POA: Insufficient documentation

## 2012-12-08 DIAGNOSIS — F411 Generalized anxiety disorder: Secondary | ICD-10-CM

## 2012-12-08 DIAGNOSIS — I10 Essential (primary) hypertension: Secondary | ICD-10-CM

## 2012-12-08 DIAGNOSIS — Z471 Aftercare following joint replacement surgery: Secondary | ICD-10-CM

## 2012-12-08 DIAGNOSIS — D510 Vitamin B12 deficiency anemia due to intrinsic factor deficiency: Secondary | ICD-10-CM

## 2012-12-08 NOTE — Assessment & Plan Note (Signed)
Patient status post left total knee arthroplasty June 2014. She has been recuperating at [facility] care center, participating with PT and occupational therapies. Patient is making slow but steady progress. She was home for to our visit yesterday reports she injured herself very much. Does tell me that her legs continue to be weak and she can't stand for very long. She is ambulating some with her walker, plans are to progress patient with ambulation. Patient has excellent range of motion with her knee minimal pain. Patient hasn't followed up with her orthopedic surgeon as scheduled on July 2 and again on July 24.

## 2012-12-08 NOTE — Assessment & Plan Note (Signed)
Patient with mildly low hemoglobin and hematocrit after hospital discharge. She is continuing to receive iron, folate and B vitamin supplementation. Will recheck CBC on Monday.

## 2012-12-08 NOTE — Progress Notes (Signed)
Patient ID: Nicole Duffy, female   DOB: [birth year], 79 y.o.   MRN: [id number] [facility] (31)  Code Status: Full Code  Contact Information   Name Relation Home Work Ava Other [phone]  [phone]       Chief Complaint  Patient presents with  . F/U Left TKA  . Deconditioning  . Anemia  . Anxiety    HPI: This is a 79 y.o. female is residing at [facility] for rehabilitation following left total knee arthroplasty 10/2012. Prior to the surgery patient was essentially wheelchair bound due to severe pain from osteoarthritis of her knee. Patient's other significant medical issues include anxiety and depression, hypertension, LE edema, pernicious anemia. Chronic medical issues been well controlled during this rehabilitation stay. Patient is working daily with physical and occupational therapies to regain physical strength and functioning to return to her independent living home.  Bathing: Minimal Assist Bed Mobility: Independent  Bladder Management: Continent, Bowel Management: Continent Feeding: Independent Hygiene and Grooming: Independent, Toileting / Clothing: Independent , Transfers: Independent, Walk: Moderate Assist and Walker    Allergies  Allergen Reactions  . Augmentin (Amoxicillin-Pot Clavulanate) Other (See Comments)    Causes severe acid reflux  . Prednisone     REACTION: GI Upset  . Sulfonamide Derivatives     REACTION: Hives, Rash   Medications    Medication List       This list is accurate as of: 12/08/12  1:14 PM.  Always use your most recent med list.               acetaminophen 500 MG tablet  Commonly known as:  TYLENOL  Take 500 mg by mouth every 4 (four) hours as needed for pain. More than 2000 mg Tylenol daily from all sources     ALPRAZolam 0.5 MG tablet  Commonly known as:  XANAX  0.5 mg 2 (two) times daily. Take one tablet by mouth three times daily for anxiety     amLODipine 10 MG  tablet  Commonly known as:  NORVASC  Take 10 mg by mouth daily.     b complex vitamins tablet  Take 1 tablet by mouth daily.     buPROPion 150 MG 24 hr tablet  Commonly known as:  WELLBUTRIN XL  Take 1 tablet (150 mg total) by mouth daily.     Cholecalciferol 1000 UNITS capsule  Take 1,000 Units by mouth daily.     DULoxetine 30 MG capsule  Commonly known as:  CYMBALTA  Take 1 capsule (30 mg total) by mouth daily.     Melatonin 3 MG Tabs  Take 3 mg by mouth at bedtime.     metoCLOPramide 5 MG tablet  Commonly known as:  REGLAN  TAKE 1 TABLET IN THE MORNING AND 1 IN THE EVENING     metoprolol succinate 25 MG 24 hr tablet  Commonly known as:  TOPROL-XL  Take 1 tablet (25 mg total) by mouth daily.     mineral/vitamin supplement 70 MG Tabs  Take 1 tablet by mouth daily.     oxyCODONE-acetaminophen 5-325 MG per tablet  Commonly known as:  PERCOCET  Take two tablets by mouth every 6 hours as needed for pain     pantoprazole 20 MG tablet  Commonly known as:  PROTONIX  TAKE 1 TABLET DAILY.     sennosides-docusate sodium 8.6-50 MG tablet  Commonly known as:  SENOKOT-S  Take by mouth 2 (two) times daily.  thyroid 90 MG tablet  Commonly known as:  ARMOUR  Take 90 mg by mouth daily.     traMADol 50 MG tablet  Commonly known as:  ULTRAM  TAKE 1 TABLET EVERY 8 HOURS AS NEEDED FOR PAIN        DATA REVIEWED  Laboratory Studies:    [facility] 11/16/2012 WBC 6.9, hemoglobin 10.5, hematocrit 31.3, platelets 471  Glucose 110, BUN 12, creatinine 0.92, sodium 140, potassium 3.7.  TSH 1.24  Review of Systems  DATA OBTAINED: from patient,medical record,  GENERAL: Feels well   No fevers, change in appetite or weight.  Tires easily SKIN: No itch, rash or open wounds EYES: No eye pain, dryness or itching  No change in vision EARS: No earache, tinnitus, change in hearing NOSE: No congestion, drainage or bleeding MOUTH/THROAT: No mouth or tooth pain  No sore throat No  difficulty chewing or swallowing RESPIRATORY: No cough, wheezing,  Mild DOE CARDIAC: No chest pain, palpitations  Chronic edema. GI: No abdominal pain  No N/V/D   No heartburn or reflux  Recent constipation GU: No dysuria, frequency or urgency  No change in urine volume or character   MUSCULOSKELETAL: Minimal left knee discomfort. No back pain  No muscle ache, pain, weakness   NEUROLOGIC: No dizziness, fainting, headache,   No change in mental status.  PSYCHIATRIC: No feelings of anxiety, depression Sleeps well.  No behavior issue.   Physical Exam Filed Vitals:   12/08/12 1318  BP: 130/76  Pulse: 79  Temp: 97.9 F (36.6 C)  Resp: 19  Height: 5\' 6"  (1.676 m)  Weight: 178 lb 14.4 oz (81.149 kg)   Body mass index is 28.89 kg/(m^2).  GENERAL APPEARANCE: No acute distress, appropriately groomed, Overweight body habitus. Alert, pleasant, conversant. SKIN: No diaphoresis, rash, unusual lesions, wounds HEAD: Normocephalic, atraumatic EYES: Conjunctiva/lids clear. Pupils round, reactive. EARS: Hearing grossly normal. NOSE: No deformity or discharge. MOUTH/THROAT: Lips w/o lesions. Oral mucosa, tongue moist, w/o lesion. Oropharynx w/o redness or lesions.  NECK: Supple, full ROM. No thyroid tenderness, enlargement or nodule LYMPHATICS: No head, neck or supraclavicular adenopathy RESPIRATORY: Breathing is even, unlabored. Lung sounds are clear and full.  CARDIOVASCULAR: Heart RRR. No murmur or extra heart sounds  ARTERIAL: No carotid bruit.   VENOUS: No varicosities. No venous stasis skin changes  EDEMA: Trace bilateral LE edema.  GASTROINTESTINAL: Abdomen is soft, non-tender, not distended w/ normal bowel sounds. MUSCULOSKELETAL: Moves UE extremities with full ROM, strength and tone. Left knee with full extension, flexion to 90, anterior knee incision is healing well. Back is without kyphosis, scoliosis or spinal process tenderness.  NEUROLOGIC: Oriented to time, place, person. Speech  clear, no tremor.   PSYCHIATRIC: Mood and affect appropriate to situation  ASSESSMENT/PLAN  HYPERTENSION, BENIGN BP well controlled on current medication , range 126-131/ 68-74.   Anemia, pernicious Patient with mildly low hemoglobin and hematocrit after hospital discharge. She is continuing to receive iron, folate and B vitamin supplementation. Will recheck CBC on Monday.  ANEMIA, PERNICIOUS Patient with mildly low hemoglobin and hematocrit after hospital discharge. She is continuing to receive iron, folate and B vitamin supplementation. Will recheck CBC on Monday.   Aftercare following joint replacement surgery Patient status post left total knee arthroplasty June 2014. She has been recuperating at [facility] care center, participating with PT and occupational therapies. Patient is making slow but steady progress. She was home for to our visit yesterday reports she injured herself very much. Does tell me that  her legs continue to be weak and she can't stand for very long. She is ambulating some with her walker, plans are to progress patient with ambulation. Patient has excellent range of motion with her knee minimal pain. Patient hasn't followed up with her orthopedic surgeon as scheduled on July 2 and again on July 24.  Physical deconditioning Prior to this patient's total knee arthroplasty she was confined to wheelchair due to severe left knee pain. This caused significant overall deconditioning. This patient is making slow and steady progress with physical therapy; is independent with ADLs, is able to transfer herself in and out of her bed and in the wheelchair. She does report she tires significantly and gets SOB with activity. Continue PT to gain strength/ endurance required to return to Independent home. Patient lives alone.  ANXIETY At admission to [facility] patients scheduled Xanax was changed to p.r.n. dosing. This was not sufficient to treat this patient's chronic anxiety. Dose was  changed to b.i.d., patient reports anxiety is managed "OK" now.    Follow up: As needed Lab 12/11/2012 CBC, B12 level, iron level  Priscilla Finklea T.Kasidy Gianino, NP-C 12/08/2012

## 2012-12-08 NOTE — Assessment & Plan Note (Addendum)
Prior to this patient's total knee arthroplasty she was confined to wheelchair due to severe left knee pain. This caused significant overall deconditioning. This patient is making slow and steady progress with physical therapy; is independent with ADLs, is able to transfer herself in and out of her bed and in the wheelchair. She does report she tires significantly and gets SOB with activity. Continue PT to gain strength/ endurance required to return to Independent home. Patient lives alone.

## 2012-12-08 NOTE — Assessment & Plan Note (Signed)
At admission to [facility] patients scheduled Xanax was changed to p.r.n. dosing. This was not sufficient to treat this patient's chronic anxiety. Dose was changed to b.i.d., patient reports anxiety is managed "OK" now.

## 2012-12-08 NOTE — Assessment & Plan Note (Signed)
Patient with mildly low hemoglobin and hematocrit after hospital discharge. She is continuing to receive iron, folate and B vitamin supplementation. Will recheck CBC on Monday. 

## 2012-12-08 NOTE — Assessment & Plan Note (Signed)
BP well controlled on current medication , range 126-131/ 68-74.

## 2012-12-18 ENCOUNTER — Other Ambulatory Visit: Payer: Self-pay | Admitting: Geriatric Medicine

## 2012-12-18 MED ORDER — OXYCODONE-ACETAMINOPHEN 5-325 MG PO TABS: ORAL_TABLET | ORAL | Status: DC

## 2012-12-19 ENCOUNTER — Other Ambulatory Visit: Payer: Self-pay | Admitting: Geriatric Medicine

## 2012-12-19 MED ORDER — OXYCODONE-ACETAMINOPHEN 5-325 MG PO TABS: ORAL_TABLET | ORAL | Status: DC

## 2013-01-04 ENCOUNTER — Other Ambulatory Visit: Payer: Self-pay | Admitting: Physician Assistant

## 2013-01-17 ENCOUNTER — Other Ambulatory Visit: Payer: Self-pay | Admitting: Physician Assistant

## 2013-01-22 ENCOUNTER — Other Ambulatory Visit: Payer: Self-pay | Admitting: *Deleted

## 2013-01-22 MED ORDER — DULOXETINE HCL 30 MG PO CPEP: ORAL_CAPSULE | ORAL | Status: DC

## 2013-02-05 ENCOUNTER — Other Ambulatory Visit: Payer: Self-pay | Admitting: Cardiology

## 2013-02-08 ENCOUNTER — Encounter: Payer: Self-pay | Admitting: Family Medicine

## 2013-02-08 ENCOUNTER — Telehealth: Payer: Self-pay | Admitting: *Deleted

## 2013-02-08 ENCOUNTER — Ambulatory Visit (INDEPENDENT_AMBULATORY_CARE_PROVIDER_SITE_OTHER): Payer: Medicare Other | Admitting: Family Medicine

## 2013-02-08 VITALS — BP 130/72 | HR 81 | Wt 176.0 lb

## 2013-02-08 DIAGNOSIS — D509 Iron deficiency anemia, unspecified: Secondary | ICD-10-CM

## 2013-02-08 DIAGNOSIS — K219 Gastro-esophageal reflux disease without esophagitis: Secondary | ICD-10-CM

## 2013-02-08 DIAGNOSIS — F329 Major depressive disorder, single episode, unspecified: Secondary | ICD-10-CM

## 2013-02-08 DIAGNOSIS — F341 Dysthymic disorder: Secondary | ICD-10-CM

## 2013-02-08 DIAGNOSIS — E348 Other specified endocrine disorders: Secondary | ICD-10-CM

## 2013-02-08 DIAGNOSIS — E039 Hypothyroidism, unspecified: Secondary | ICD-10-CM

## 2013-02-08 LAB — CBC
HCT: 41.2 % (ref 36.0–46.0)
MCH: 28.3 pg (ref 26.0–34.0)
MCV: 85.7 fL (ref 78.0–100.0)
RBC: 4.81 MIL/uL (ref 3.87–5.11)
WBC: 10 10*3/uL (ref 4.0–10.5)

## 2013-02-08 NOTE — Patient Instructions (Addendum)
Buproprion - Take every other day for one week and then stop.  Decrease Reglan to once a day Decrease alprazolam to twice a day for one month.  Use tramadol for more mild and moderate pain Decrease protonix to every other days

## 2013-02-08 NOTE — Telephone Encounter (Signed)
Pt calls to let you know that she is taking 60mg  of the armour thyroid med.

## 2013-02-08 NOTE — Progress Notes (Signed)
Subjective:    Patient ID: Nicole Duffy, female    DOB: March 21, 1932, 79 y.o.   MRN: [id number]  HPI She stayed in rehabe for 5-6 weeks after her left knee replacement. She is getting PT twice a week. Ends last next week. She brought in all of her medications today and she and her daughter were quite to go through each one to see why she's taking it and if she still needs to take the medication can be adjusted or not.  Hypothyroid - she's actually she not sure which day she is taking. She brought in 2 bottles of Armour Thyroid. One is 60 mcg and one is 90 mcg. Encouraged her to check at home and call me back so I know which one she is taking based on the blood level that we get back.  Anxiety/depression-she is taking her Xanax 3 times a day. It was increased from twice a day to 3 times a day while she was at the rehabilitation facility. She is still taking it 3 times a day. She did run out of her Wellbutrin for about a week and did not take it and says that quite frankly she really didn't notice a difference as far as her mood her anxiety was concerned./Depression.  GERD-her symptoms are right now fairly well controlled. She is worried about discontinuing the reflux medication because she has gone to the emergency room department for chest pain and it turned out to be reflux.  Attention-no chest pain or short of breath. Taking medications without side effects.  She is taking melatonin for sleep but says she sleeps great. Review of Systems     Objective:   Physical Exam  Constitutional: She is oriented to person, place, and time. She appears well-developed and well-nourished.  HENT:  Head: Normocephalic and atraumatic.  Eyes: Conjunctivae and EOM are normal.  Cardiovascular: Normal rate.   Pulmonary/Chest: Effort normal.  Neurological: She is alert and oriented to person, place, and time.  Skin: Skin is dry. No pallor.  Psychiatric: She has a normal mood and affect. Her behavior is normal.           Assessment & Plan:  Anxiety/depression-we discussed the importance of weaning back on the Xanax and trying to eventually get her completely off except for possible when necessary use. Warned about increased risk of falls and development of dementia with long-term use of this medication. We will start with decreasing her Xanax down to twice a day which is what she was on before she went to rehabilitation. Followup in one month and we can work on continued to wean her down. We will also wean the Wellbutrin. She should be completely off by the time I see her back in one month. We will keep the Cymbalta for now. Can be helpful for depression as well as chronic pain we may need to adjust her dose when I see her back.  Hypothyroid - Due to recheck. She is is to call back with which day she's actually taking so I can adjust her medication if it needs to be adjusted.  GERD-we will try to decrease her Reglan down to once a day and try decreasing her Protonix to every other day. I would like to see her back in one month to see if she was able to do this or if she started to have symptoms.  Hypertension-well-controlled on current regimen of amlodipine and metoprolol. Her that we need to continue these medications and we did not make  any adjustments to these today.  Left knee replacement-she does have about 30 tablets of oxycodone left. Her that this will likely be her last refill from her orthopedist. I encouraged her to go ahead and start cutting the tablets in half hopefully as she is healing and her pain is getting under better control I explained the importance of weaning down on pain medications. She can certainly reduce this medication to half a tab and then use her tramadol for more mild to moderate pain.  Encouraged her not to use the melatonin she sleeping really well.   Time spent 35 minutes, greater than 50% time spent counseling about her medications.

## 2013-02-19 ENCOUNTER — Other Ambulatory Visit: Payer: Self-pay | Admitting: Family Medicine

## 2013-03-08 ENCOUNTER — Encounter: Payer: Self-pay | Admitting: Family Medicine

## 2013-03-08 ENCOUNTER — Ambulatory Visit (INDEPENDENT_AMBULATORY_CARE_PROVIDER_SITE_OTHER): Payer: Medicare Other | Admitting: Family Medicine

## 2013-03-08 VITALS — BP 115/59 | HR 85 | Wt 175.0 lb

## 2013-03-08 DIAGNOSIS — F329 Major depressive disorder, single episode, unspecified: Secondary | ICD-10-CM

## 2013-03-08 DIAGNOSIS — I10 Essential (primary) hypertension: Secondary | ICD-10-CM

## 2013-03-08 DIAGNOSIS — F341 Dysthymic disorder: Secondary | ICD-10-CM

## 2013-03-08 DIAGNOSIS — N951 Menopausal and female climacteric states: Secondary | ICD-10-CM

## 2013-03-08 DIAGNOSIS — K219 Gastro-esophageal reflux disease without esophagitis: Secondary | ICD-10-CM

## 2013-03-08 DIAGNOSIS — E785 Hyperlipidemia, unspecified: Secondary | ICD-10-CM

## 2013-03-08 LAB — LIPID PANEL
Cholesterol: 209 mg/dL — ABNORMAL HIGH (ref 0–200)
LDL Cholesterol: 110 mg/dL — ABNORMAL HIGH (ref 0–99)
Total CHOL/HDL Ratio: 3.6 Ratio
Triglycerides: 203 mg/dL — ABNORMAL HIGH (ref <150)
VLDL: 41 mg/dL — ABNORMAL HIGH (ref 0–40)

## 2013-03-08 MED ORDER — METOCLOPRAMIDE HCL 5 MG PO TABS: 5.0000 mg | ORAL_TABLET | ORAL | Status: DC

## 2013-03-08 MED ORDER — ALPRAZOLAM 0.25 MG PO TABS: ORAL_TABLET | ORAL | Status: DC

## 2013-03-08 NOTE — Progress Notes (Signed)
Subjective:    Patient ID: Nicole Duffy, female    DOB: [birth year], 79 y.o.   MRN: [id number]  HPI  Here for followup today and medication review: Her goal has been to try and wean her off of medications that she wants no longer needs.  Anxiety/depression-we had  discussed the importance of weaning back on the Xanax at last OV, and trying to eventually get her completely off except for possible when necessary use. We also weaned her Wellbutrin. We left the Cymbalta since it also helps her back pain. xaxax is half in AM and whole at nighrt   GERD-we tried to  decrease her Reglan down to once a day and try decreased her Protonix to every other day.She is taking the Reglan once a day and on the protoniex every other day.   Hypertension-well-controlled on current regimen of amlodipine and metoprolol. We did not adjust these medications at the last visit.   Knee replacement - Had encouraged her to start weaning down her oxycodone that was prescribed by her orthopedist. He is given her 30 tabs at the last visit when I saw her. Explained her that the goal is to get completely off of narcotic pain medication. She is completely off the oxycodone.    I also encouraged her stop her melatonin said she was sleeping well again and did not really need it anymore. .  Hypothyroidism-last TSH looked great the month ago.  Review of Systems BP 115/59  Pulse 85  Wt 175 lb (79.379 kg)  BMI 28.26 kg/m2    Allergies  Allergen Reactions  . Augmentin [Amoxicillin-Pot Clavulanate] Other (See Comments)    Causes severe acid reflux  . Prednisone     REACTION: GI Upset  . Sulfonamide Derivatives     REACTION: Hives, Rash    Past Medical History  Diagnosis Date  . Benign essential HTN   . Osteoporosis   . Insulin resistance syndrome   . Anemia, pernicious   . Anxiety   . Hypothyroidism   . Anxiety   . Depressed   . HLD (hyperlipidemia)   . GERD (gastroesophageal reflux disease)   . PUD (peptic  ulcer disease)     remote  . Diastolic CHF   . Osteoarthritis, knee     s/p L TKA 10/2012  . Aftercare following joint replacement surgery     L TKA 10/2012    Past Surgical History  Procedure Laterality Date  . Hysterectomy (other)    . Tubal ligation    . Tonsillectomy    . L total knee arthroplasty Left 10/2012    History   Social History  . Marital Status: Divorced    Spouse Name: N/A    Number of Children: N/A  . Years of Education: N/A   Occupational History  . Not on file.   Social History Main Topics  . Smoking status: Never Smoker   . Smokeless tobacco: Not on file  . Alcohol Use: No  . Drug Use: No  . Sexual Activity:    Other Topics Concern  . Not on file   Social History Narrative   Lives in assisted living center. Does not exercise.     Family History  Problem Relation Age of Onset  . Diabetes Brother   . Colon cancer Other     1st degree relative <60  . Ovarian cancer Other   . Hypertension Brother   . Hyperlipidemia Brother   . Depression Brother     Outpatient  Encounter Prescriptions as of 03/08/2013  Medication Sig Dispense Refill  . acetaminophen (TYLENOL) 500 MG tablet Take 500 mg by mouth every 4 (four) hours as needed for pain. More than 2000 mg Tylenol daily from all sources      . ALPRAZolam (XANAX) 0.25 MG tablet TAKE 1 TABLET TWO TIMES DAILY AS NEEDED FOR SLEEP OR ANXIETY.  60 tablet  0  . amLODipine (NORVASC) 10 MG tablet Take 10 mg by mouth daily. 1/2 tab daily      . b complex vitamins tablet Take 1 tablet by mouth daily.      . Cholecalciferol 1000 UNITS capsule Take 1,000 Units by mouth daily.      . DULoxetine (CYMBALTA) 30 MG capsule Take 1 capsule (30 mg total) by mouth daily.  30 capsule  3  . Iron TABS Take 1 tablet by mouth daily.      . Melatonin 3 MG TABS Take 3 mg by mouth at bedtime.      . metoCLOPramide (REGLAN) 5 MG tablet Take 1 tablet (5 mg total) by mouth every other day.  1 tablet  0  . metoprolol succinate  (TOPROL-XL) 25 MG 24 hr tablet Take 1 tablet (25 mg total) by mouth daily.  30 tablet  3  . mineral/vitamin supplement (MULTIGEN) 70 MG TABS Take 1 tablet by mouth daily.      . pantoprazole (PROTONIX) 20 MG tablet TAKE 1 Every other day TABLET DAILY.      Marland Kitchen thyroid (ARMOUR) 60 MG tablet Take 60 mg by mouth daily before breakfast.      . traMADol (ULTRAM) 50 MG tablet TAKE 1 TABLET EVERY 8 HOURS AS NEEDED FOR PAIN  60 tablet  0  . [DISCONTINUED] ALPRAZolam (XANAX) 0.5 MG tablet TAKE 1 TABLET TWO TIMES DAILY AS NEEDED FOR SLEEP OR ANXIETY.  60 tablet  0  . [DISCONTINUED] metoCLOPramide (REGLAN) 5 MG tablet       . [DISCONTINUED] pantoprazole (PROTONIX) 20 MG tablet TAKE 1 TABLET DAILY.  90 tablet  3  . [DISCONTINUED] oxyCODONE-acetaminophen (PERCOCET) 5-325 MG per tablet Take two tablets by mouth every 6 hours as needed for pain  240 tablet  0  . [DISCONTINUED] sennosides-docusate sodium (SENOKOT-S) 8.6-50 MG tablet Take by mouth 2 (two) times daily.       Facility-Administered Encounter Medications as of 03/08/2013  Medication Dose Route Frequency Provider Last Rate Last Dose  . albuterol (PROVENTIL) (2.5 MG/3ML) 0.083% nebulizer solution 2.5 mg  2.5 mg Nebulization Q6H PRN Agapito Games, MD   2.5 mg at 04/30/11 1022          Objective:   Physical Exam  Constitutional: She is oriented to person, place, and time. She appears well-developed and well-nourished.  HENT:  Head: Normocephalic and atraumatic.  Cardiovascular: Normal rate, regular rhythm and normal heart sounds.   Pulmonary/Chest: Effort normal and breath sounds normal.  Neurological: She is alert and oriented to person, place, and time.  Skin: Skin is warm and dry.  Psychiatric: She has a normal mood and affect. Her behavior is normal.          Assessment & Plan:  Anxiety/depression - overall she's actually done fairly well with the wean on the Xanax. We are going very slowly. She's down to half a tab in the  morning and a whole tab at night. She can finish out what she has a wedding at her new perception for the 0.25 mg dose. She will take 1  in the morning and one in the evening. I will see her back in about 6 weeks.  GERD-she's doing well with the decreased dose of Reglan to once a day. She would like try to take it every other day and continuing her Protonix every other day. I think this is reasonable.  Hypertension- we'll decrease amlodipine to 5 mg. She can cut the 10 mg tabs in half. Continue metoprolol. Follow up in 6 weeks.  She is completely off of her narcotics after her knee replacement. She does have tramadol which she uses as needed.

## 2013-03-13 ENCOUNTER — Other Ambulatory Visit: Payer: Self-pay | Admitting: Family Medicine

## 2013-03-14 ENCOUNTER — Telehealth: Payer: Self-pay | Admitting: *Deleted

## 2013-03-14 MED ORDER — TRAMADOL HCL 50 MG PO TABS: ORAL_TABLET | ORAL | Status: DC

## 2013-03-14 NOTE — Telephone Encounter (Signed)
Pt called stating that with her taking the Protonix QOD that she is having problems with acid reflux and wants to know if she can take it daily. She also states she knows you are trying to take her off her Xanax per family request but wants to know if she can take 1 tab (0.5mg  total) BID instead of the half BID. States she feels like she is coming off them to quickly. Please advise.  Meyer Cory, LPN

## 2013-03-14 NOTE — Telephone Encounter (Signed)
Ok to refill tramadol 

## 2013-03-14 NOTE — Telephone Encounter (Signed)
Pt informed. She is asking if we can refill her Tramadol? Please advise since you are adjusting some of her medications.  Meyer Cory, LPN

## 2013-03-14 NOTE — Telephone Encounter (Signed)
Okay to go back on the Protonix daily. Needs to continue half a tab of Xanax twice a day.

## 2013-04-02 ENCOUNTER — Other Ambulatory Visit: Payer: Self-pay | Admitting: Family Medicine

## 2013-04-02 NOTE — Telephone Encounter (Signed)
I see that you are weaning her off of xanax. Is she  To take this med how it was last rx'ed?

## 2013-04-05 NOTE — Telephone Encounter (Signed)
NEW RX SENT WITH NEW DIRICTIONS AND QT 50.

## 2013-04-09 ENCOUNTER — Other Ambulatory Visit: Payer: Self-pay | Admitting: Family Medicine

## 2013-04-19 ENCOUNTER — Encounter: Payer: Self-pay | Admitting: Family Medicine

## 2013-04-19 ENCOUNTER — Ambulatory Visit (INDEPENDENT_AMBULATORY_CARE_PROVIDER_SITE_OTHER): Payer: Medicare Other | Admitting: Family Medicine

## 2013-04-19 VITALS — BP 124/59 | HR 74 | Temp 97.8°F | Ht 66.0 in | Wt 175.0 lb

## 2013-04-19 DIAGNOSIS — F411 Generalized anxiety disorder: Secondary | ICD-10-CM

## 2013-04-19 DIAGNOSIS — R29898 Other symptoms and signs involving the musculoskeletal system: Secondary | ICD-10-CM

## 2013-04-19 DIAGNOSIS — I10 Essential (primary) hypertension: Secondary | ICD-10-CM

## 2013-04-19 MED ORDER — BUSPIRONE HCL 7.5 MG PO TABS: 7.5000 mg | ORAL_TABLET | Freq: Three times a day (TID) | ORAL | Status: DC

## 2013-04-19 NOTE — Progress Notes (Signed)
Subjective:    Patient ID: Nicole Duffy, female    DOB: 03-Aug-1931, 79 y.o.   MRN: [id number]  HPI Here for medication review. We have been working on chaning some of hre meds.  She takes the tramadol about once a day or less.    GAD- he still feels extremely anxious. She feels that she can crawl out of her skin. She also feels like she's been a little bit down lately. She denies feeling tearful overwhelmed herself. She feels like she just hasn't been motivated to get up and interact with other people. She just wants [place] in her care with her comforter. She's currently taking Cymbalta 30 mg. When she is trying to increase her dose in the past she had visual hallucinations and saw snakes. She's been on multiple medications in the past. She says Zoloft it and work. She had side effects with Prozac and she can't members exactly what they were. She think she took Effexor as well and had side effects but was on for quite some time. She also reports having been on Levitra at one time. She does remember taking BuSpar for some time and remembers that it was helpful and that she tolerated it well. She says she can't remember why it eventually was stopped. Her son currently takes Effexor and does well with it.  HTN-  Pt denies chest pain, SOB, dizziness, or heart palpitations.  Taking meds as directed w/o problems.  Denies medication side effects.  She still complains of fatigue.  Extremity weakness-I had asked her to walk at least 5-10 minutes twice a day when I saw her last time. She admits she has not been doing this at all. Her daughter confirms that she's just been sitting in her chair most of the day..  Review of Systems  BP 124/59  Pulse 74  Temp(Src) 97.8 F (36.6 C) (Oral)  Ht 5\' 6"  (1.676 m)  Wt 175 lb (79.379 kg)  BMI 28.26 kg/m2    Allergies  Allergen Reactions  . Augmentin [Amoxicillin-Pot Clavulanate] Other (See Comments)    Causes severe acid reflux  . Prednisone     REACTION: GI  Upset  . Sulfonamide Derivatives     REACTION: Hives, Rash    Past Medical History  Diagnosis Date  . Benign essential HTN   . Osteoporosis   . Insulin resistance syndrome   . Anemia, pernicious   . Anxiety   . Hypothyroidism   . Anxiety   . Depressed   . HLD (hyperlipidemia)   . GERD (gastroesophageal reflux disease)   . PUD (peptic ulcer disease)     remote  . Diastolic CHF   . Osteoarthritis, knee     s/p L TKA 10/2012  . Aftercare following joint replacement surgery     L TKA 10/2012    Past Surgical History  Procedure Laterality Date  . Hysterectomy (other)    . Tubal ligation    . Tonsillectomy    . L total knee arthroplasty Left 10/2012    History   Social History  . Marital Status: Divorced    Spouse Name: N/A    Number of Children: N/A  . Years of Education: N/A   Occupational History  . Not on file.   Social History Main Topics  . Smoking status: Never Smoker   . Smokeless tobacco: Not on file  . Alcohol Use: No  . Drug Use: No  . Sexual Activity:    Other Topics Concern  .  Not on file   Social History Narrative   Lives in assisted living center. Does not exercise.     Family History  Problem Relation Age of Onset  . Diabetes Brother   . Colon cancer Other     1st degree relative <60  . Ovarian cancer Other   . Hypertension Brother   . Hyperlipidemia Brother   . Depression Brother     Outpatient Encounter Prescriptions as of 04/19/2013  Medication Sig  . acetaminophen (TYLENOL) 500 MG tablet Take 500 mg by mouth every 4 (four) hours as needed for pain. More than 2000 mg Tylenol daily from all sources  . ALPRAZolam (XANAX) 0.25 MG tablet TAKE 1 TABLET TWO TIMES DAILY AS NEEDED FOR SLEEP OR ANXIETY.  Marland Kitchen amLODipine (NORVASC) 10 MG tablet Take 10 mg by mouth daily. 1/2 tab daily  . b complex vitamins tablet Take 1 tablet by mouth daily.  . busPIRone (BUSPAR) 7.5 MG tablet Take 1 tablet (7.5 mg total) by mouth 3 (three) times daily.  .  Cholecalciferol 1000 UNITS capsule Take 1,000 Units by mouth daily.  . Iron TABS Take 1 tablet by mouth daily.  . Melatonin 3 MG TABS Take 3 mg by mouth at bedtime.  . metoprolol succinate (TOPROL-XL) 25 MG 24 hr tablet Take 1 tablet (25 mg total) by mouth daily.  . mineral/vitamin supplement (MULTIGEN) 70 MG TABS Take 1 tablet by mouth daily.  . pantoprazole (PROTONIX) 20 MG tablet TAKE 1 TABLET DAILY.  Marland Kitchen thyroid (ARMOUR) 60 MG tablet Take 60 mg by mouth daily before breakfast.  . traMADol (ULTRAM) 50 MG tablet TAKE 1 TABLET EVERY 8 HOURS AS NEEDED FOR PAIN  . [DISCONTINUED] ALPRAZolam (XANAX) 0.5 MG tablet TAKE 1 TABLET TWICE DAILY AS NEEDED  . [DISCONTINUED] DULoxetine (CYMBALTA) 30 MG capsule Take 1 capsule (30 mg total) by mouth daily.  . [DISCONTINUED] metoCLOPramide (REGLAN) 5 MG tablet Take 1 tablet (5 mg total) by mouth every other day.          Objective:   Physical Exam  Constitutional: She is oriented to person, place, and time. She appears well-developed and well-nourished.  HENT:  Head: Normocephalic and atraumatic.  Cardiovascular: Normal rate, regular rhythm and normal heart sounds.   Pulmonary/Chest: Effort normal and breath sounds normal.  Neurological: She is alert and oriented to person, place, and time.  Skin: Skin is warm and dry.  Psychiatric: She has a normal mood and affect. Her behavior is normal.          Assessment & Plan:  GAD - not well controlled. We can try to wean her Xanax down to 0.25 mg twice a day. Would like to get her off this completely because of increase risk of sedation and falls. We are just reducing her dose very gradually. Atelectasis Cymbalta has been ineffective as far as mood control for her period and since we cannot increase the dose because of side effects I would like to discontinue the medication. She will take it every other day for a week and then stop. And we will start BuSpar.  I would like to see her back in one month. If  it's not helping even after dose adjustments we could consider retrying Effexor since this does work very well for her son.  Hypertension-well-controlled. Continue current regimen. She's happy with her regimen.  Lower extremity weakness-patient is okay with referral for home physical therapy. I think this would potentially help motivate her to get moving. I also  think that her inactivity may be related to mild depression as well.

## 2013-04-19 NOTE — Patient Instructions (Addendum)
Take the cymbalta every other day for a week and then stop.  Can go ahead the buspar twice a day for one week and then can increase to three times a day.

## 2013-04-30 ENCOUNTER — Other Ambulatory Visit: Payer: Self-pay | Admitting: Family Medicine

## 2013-05-01 ENCOUNTER — Other Ambulatory Visit: Payer: Self-pay | Admitting: Family Medicine

## 2013-05-02 ENCOUNTER — Telehealth: Payer: Self-pay | Admitting: *Deleted

## 2013-05-02 NOTE — Telephone Encounter (Signed)
Pt calls & states that she is having a hard time tapering down on the xanax. According to the notes, she is taking .25mg  of the xanax bid.  She states that she is having a hard time sleeping.  I had her look at her bottles & the pills because her daughter-in-law puts her meds in a daily container for her & she was unsure of how many she was taking.  Once she looked, she realized she was only taking one a day. I advised her to start taking it bid & to f/u with [place]. Her appt is on 1/2 @ 1:15.

## 2013-05-04 ENCOUNTER — Telehealth: Payer: Self-pay | Admitting: *Deleted

## 2013-05-04 MED ORDER — DULOXETINE HCL 30 MG PO CPEP: ORAL_CAPSULE | ORAL | Status: DC

## 2013-05-04 NOTE — Telephone Encounter (Signed)
Seen her for PT today and stated that pt is having a lot of anxiety and she was unable to do her therapy today. also pt reported that she had a severe panic attack this morning as reported by the housekeeper. PT is requesting that skilled nursing come out to do med reconciliation with pt and would like an order for this. I told her that would be fine. She stated that she would take the VO from me to start the process.Nicole Duffy

## 2013-05-04 NOTE — Telephone Encounter (Signed)
Called pt to ask if the buspar is working for her she reports that it is not and she wished that she did not stop the cymbalta. I told her that we could restart the cymbalta if she would like she stated that she would. [facility]. Pt informed to discontinue the buspar. She voiced understanding and agreed.Nicole Duffy

## 2013-05-08 DIAGNOSIS — IMO0001 Reserved for inherently not codable concepts without codable children: Secondary | ICD-10-CM

## 2013-05-08 DIAGNOSIS — R29898 Other symptoms and signs involving the musculoskeletal system: Secondary | ICD-10-CM

## 2013-05-08 DIAGNOSIS — D649 Anemia, unspecified: Secondary | ICD-10-CM

## 2013-05-08 DIAGNOSIS — F341 Dysthymic disorder: Secondary | ICD-10-CM

## 2013-05-18 ENCOUNTER — Encounter: Payer: Self-pay | Admitting: Family Medicine

## 2013-05-18 ENCOUNTER — Ambulatory Visit (INDEPENDENT_AMBULATORY_CARE_PROVIDER_SITE_OTHER): Payer: Medicare Other | Admitting: Family Medicine

## 2013-05-18 VITALS — BP 140/64 | HR 84 | Temp 98.4°F | Wt 176.0 lb

## 2013-05-18 DIAGNOSIS — F411 Generalized anxiety disorder: Secondary | ICD-10-CM

## 2013-05-18 DIAGNOSIS — F3289 Other specified depressive episodes: Secondary | ICD-10-CM

## 2013-05-18 DIAGNOSIS — R5381 Other malaise: Secondary | ICD-10-CM

## 2013-05-18 DIAGNOSIS — F329 Major depressive disorder, single episode, unspecified: Secondary | ICD-10-CM

## 2013-05-18 DIAGNOSIS — R5383 Other fatigue: Secondary | ICD-10-CM

## 2013-05-18 DIAGNOSIS — I10 Essential (primary) hypertension: Secondary | ICD-10-CM

## 2013-05-18 DIAGNOSIS — R531 Weakness: Secondary | ICD-10-CM

## 2013-05-18 DIAGNOSIS — F419 Anxiety disorder, unspecified: Secondary | ICD-10-CM

## 2013-05-18 MED ORDER — ALPRAZOLAM 0.25 MG PO TABS: ORAL_TABLET | ORAL | Status: DC

## 2013-05-18 NOTE — Progress Notes (Signed)
   Subjective:    Patient ID: Nicole Duffy, female    DOB: 02-May-1932, 80 y.o.   MRN: [id number]  HPI She felt better on the cymbalta than the Buspar.  She plans on staying on the xanax.  Had a lot of S.E on the buspar. It was keeping her awake. It mader her very fatigued and foggy headed.  She feels much better since off the Buspar.  Off the Aleve. Hasn't been walking very much.   PT has been coming out.  She says Aleve doesn't help much for pain. Her sinuses are with her today.  Generalized weakness-she has about 2 more weeks of physical therapy. Has been somewhat helpful but she's just not very motivated to ambulate. Her son is somewhat pressured with her for this. He says even on days where she feels better she will get up and walk.  Review of Systems     Objective:   Physical Exam  Constitutional: She is oriented to person, place, and time. She appears well-developed and well-nourished.  HENT:  Head: Normocephalic and atraumatic.  Cardiovascular: Normal rate, regular rhythm and normal heart sounds.   Pulmonary/Chest: Effort normal and breath sounds normal.  Neurological: She is alert and oriented to person, place, and time.  Skin: Skin is warm and dry.  Psychiatric: She has a normal mood and affect. Her behavior is normal.          Assessment & Plan:  Anxiety- She is feeling much better on Cymbalta. Stay with xanax BID.  Will add BuSpar to her intolerance list. I think the Cymbalta so much better regimen for her. We had originally discontinued it because she felt it wasn't really working well. But now she feels like it makes a big difference. We have been able to gradually wean her Xanax dose from 0.5 mg 3 times a day to 0.25 mg twice a day. She really does not want to go down any further. We will keep it at the current regimen. We also discussed strategies to help calm herself when she starts to feel more anxious. Explained her that she needs to find something relaxing and  pleasurable to help stimulate the appropriate brain chemicals to help counteract her emotions of anxiety. Instead of depending on the pill to do this for her.  Hypertension-well-controlled. Continue current regimen.  Generalized weakness-encourage ambulation. Even for just a short distance across the house or across the room. Staying active. On continuing to work on lower extremity strengthening with physical therapy.

## 2013-05-19 LAB — COMPLETE METABOLIC PANEL WITH GFR
ALBUMIN: 3.6 g/dL (ref 3.5–5.2)
ALK PHOS: 93 U/L (ref 39–117)
ALT: 10 U/L (ref 0–35)
AST: 15 U/L (ref 0–37)
BUN: 11 mg/dL (ref 6–23)
CO2: 27 meq/L (ref 19–32)
Calcium: 9 mg/dL (ref 8.4–10.5)
Chloride: 102 mEq/L (ref 96–112)
Creat: 0.8 mg/dL (ref 0.50–1.10)
GFR, EST AFRICAN AMERICAN: 81 mL/min
GFR, EST NON AFRICAN AMERICAN: 70 mL/min
Glucose, Bld: 96 mg/dL (ref 70–99)
POTASSIUM: 4.1 meq/L (ref 3.5–5.3)
SODIUM: 139 meq/L (ref 135–145)
Total Bilirubin: 0.8 mg/dL (ref 0.3–1.2)
Total Protein: 5.6 g/dL — ABNORMAL LOW (ref 6.0–8.3)

## 2013-05-20 NOTE — Progress Notes (Signed)
Quick Note:  All labs are normal. ______ 

## 2013-06-04 ENCOUNTER — Other Ambulatory Visit: Payer: Self-pay | Admitting: Family Medicine

## 2013-06-18 ENCOUNTER — Telehealth: Payer: Self-pay | Admitting: *Deleted

## 2013-06-18 ENCOUNTER — Other Ambulatory Visit: Payer: Self-pay | Admitting: Cardiology

## 2013-06-18 ENCOUNTER — Other Ambulatory Visit: Payer: Self-pay | Admitting: *Deleted

## 2013-06-18 MED ORDER — PANTOPRAZOLE SODIUM 20 MG PO TBEC: DELAYED_RELEASE_TABLET | ORAL | Status: DC

## 2013-06-18 NOTE — Telephone Encounter (Signed)
Would like orders to cont. PT with pt for a few more weeks. Gave OV she will send appropriate paperwork.Nicole Duffy [place]

## 2013-07-04 ENCOUNTER — Other Ambulatory Visit: Payer: Self-pay | Admitting: Family Medicine

## 2013-07-05 ENCOUNTER — Other Ambulatory Visit: Payer: Self-pay | Admitting: Family Medicine

## 2013-07-24 ENCOUNTER — Other Ambulatory Visit: Payer: Self-pay | Admitting: Family Medicine

## 2013-07-31 ENCOUNTER — Other Ambulatory Visit: Payer: Self-pay | Admitting: Sports Medicine

## 2013-07-31 NOTE — Telephone Encounter (Signed)
To PCP

## 2013-08-02 ENCOUNTER — Telehealth: Payer: Self-pay | Admitting: *Deleted

## 2013-08-02 NOTE — Telephone Encounter (Signed)
Please call patient and see if there something going on with her. We can also talk with her son

## 2013-08-02 NOTE — Telephone Encounter (Signed)
Nicole Duffy [facility] and reports pt cancelled both of her PT visits this week and wanted Dr. Madilyn Fireman to be aware of this.[name] [place]

## 2013-08-09 ENCOUNTER — Other Ambulatory Visit: Payer: Self-pay | Admitting: Sports Medicine

## 2013-08-09 ENCOUNTER — Other Ambulatory Visit: Payer: Self-pay | Admitting: Physician Assistant

## 2013-08-09 NOTE — Telephone Encounter (Signed)
To PCP

## 2013-08-20 ENCOUNTER — Ambulatory Visit: Payer: Medicare Other | Admitting: Family Medicine

## 2013-08-23 ENCOUNTER — Telehealth: Payer: Self-pay | Admitting: *Deleted

## 2013-08-23 ENCOUNTER — Encounter: Payer: Self-pay | Admitting: Family Medicine

## 2013-08-23 ENCOUNTER — Ambulatory Visit (INDEPENDENT_AMBULATORY_CARE_PROVIDER_SITE_OTHER): Payer: Medicare Other | Admitting: Family Medicine

## 2013-08-23 VITALS — BP 132/71 | HR 90 | Wt 176.0 lb

## 2013-08-23 DIAGNOSIS — E348 Other specified endocrine disorders: Secondary | ICD-10-CM

## 2013-08-23 DIAGNOSIS — F32A Depression, unspecified: Secondary | ICD-10-CM

## 2013-08-23 DIAGNOSIS — E519 Thiamine deficiency, unspecified: Secondary | ICD-10-CM

## 2013-08-23 DIAGNOSIS — F3289 Other specified depressive episodes: Secondary | ICD-10-CM

## 2013-08-23 DIAGNOSIS — D509 Iron deficiency anemia, unspecified: Secondary | ICD-10-CM

## 2013-08-23 DIAGNOSIS — E611 Iron deficiency: Secondary | ICD-10-CM

## 2013-08-23 DIAGNOSIS — I10 Essential (primary) hypertension: Secondary | ICD-10-CM

## 2013-08-23 DIAGNOSIS — F329 Major depressive disorder, single episode, unspecified: Secondary | ICD-10-CM

## 2013-08-23 LAB — POCT GLYCOSYLATED HEMOGLOBIN (HGB A1C): HEMOGLOBIN A1C: 5.5

## 2013-08-23 LAB — FERRITIN: FERRITIN: 53 ng/mL (ref 10–291)

## 2013-08-23 MED ORDER — VILAZODONE HCL 10 & 20 & 40 MG PO KIT: 1.0000 | PACK | Freq: Every day | ORAL | Status: DC

## 2013-08-23 NOTE — Telephone Encounter (Signed)
Called and lvm informing pt's son that Dr. Metheney would like for her to have her vitamin B1 checked and she will need to come back to have this done. I informed that he can bring her back and go to the lab to have this done. He was advised to call back w/any questions.Marilea Gwynne L Olukemi Panchal  

## 2013-08-23 NOTE — Progress Notes (Signed)
   Subjective:    Patient ID: Nicole Duffy, female    DOB: [birth year], 80 y.o.   MRN: [id number]  HPI Low ferritin-due to recheck today. Still complains of significant fatigue.  Impaired fasting glucose-last A1c was 5.8. Due to repeat today. No hypoglycemic events.  Has been eating alot of cookies and ice cream.   Depression and anxiety- On cymbalta and dong Ok. She feels like it's helping some. She was unable to tolerate the 60 mg dose previously. She still just doesn't feel right. She complains of feeling extremely fatigued and feeling like she is in a brain fog. The she still a little bit better the last 2 days. Her son and daughter-in-law are very concerned because she spends a lot of her day in her room and sleeping. She's not really socializing anymore. She did complete physical therapy but missed a lot of the appointments. She feels like her pain levels and her knee and back also contribute to her poor mood.  She just says she is tired of hurting and feels like giving up sometimes. Now having more pain in her right knee and lower back. Dr. Creig Duffy is her orthopedist and had recommended surgery in the past. She last saw him approximately 6 months ago.   Review of Systems     Objective:   Physical Exam  Constitutional: She is oriented to person, place, and time. She appears well-developed and well-nourished.  HENT:  Head: Normocephalic and atraumatic.  Eyes: Conjunctivae and EOM are normal.  Cardiovascular: Normal rate.   Pulmonary/Chest: Effort normal.  Neurological: She is alert and oriented to person, place, and time.  Skin: Skin is dry. No pallor.  Psychiatric: She has a normal mood and affect. Her behavior is normal.          Assessment & Plan:  Low iron-recheck ferritin today. If still low could be contributing to her fatigue. I think her mood is the worse contributor.  Impaired fasting glucose-due for repeat A1c. Well controlled with an A1c of 5.5 today. Recommend  repeat in 6-12 months.  Depression-I really think her predominant symptom is depression right now after talking to her and her son. We will wean her Cymbalta and try putting her on Viibryd. We had previously tried increasing her Cymbalta 60 mg and it caused her to have hallucinations. She saw snakes. I would like to see her back in 3-4 weeks to make sure that she's tolerating it well. I also think we need to work very diligently on getting her pain under better control as I think this is a big factor with her depressed mood. We'll refer her to my partner Dr. Aundria Duffy for further evaluation and treatment especially of her knees and back. Her regular orthopedist is in [place], Dr. Creig Duffy. She think she saw him about 6 months ago.  B1 deficiency-recommend repeat labs since he is still low levels. Could be contributing to her fatigue.  Time spent 25 minutes, greater than 50% spent counseling about her low iron, impaired fasting glucose, and depression.

## 2013-08-23 NOTE — Progress Notes (Signed)
Called and lvm informing pt's son that Dr. Madilyn Fireman would like for her to have her vitamin B1 checked and she will need to come back to have this done. I informed that he can bring her back and go to the lab to have this done. He was advised to call back w/any questions.Teddy Spike

## 2013-08-23 NOTE — Patient Instructions (Signed)
Decrease the cymbalta to one every other day for 8 days and then stop, and then start the [place].

## 2013-08-24 ENCOUNTER — Encounter: Payer: Self-pay | Admitting: Family Medicine

## 2013-08-29 ENCOUNTER — Telehealth: Payer: Self-pay | Admitting: *Deleted

## 2013-08-29 MED ORDER — VILAZODONE HCL 40 MG PO TABS: 40.0000 mg | ORAL_TABLET | Freq: Every day | ORAL | Status: DC

## 2013-08-29 NOTE — Telephone Encounter (Signed)
She is correct. I think then we put in that kit at it was supposed to bea sample and it must have been sent as an actual prescription. I wouldn't send over new prescription for the 40 mg tab. I recommend that she complete the starter kit there before beginning the new per scription. She can also use the 40 mg out of the kit as well as taking 2 of the 20 mg to make 40 mg IV kit if she already paid for it.

## 2013-08-29 NOTE — Telephone Encounter (Signed)
Patient calls and states she picked up med from [facility] and it was the same kit as you gave her in the office as a sample.  It is the $Remo'10mg'xZXnl$ '20mg'$ , and $Rem'40mg'FvSs$  and she said that she didn't think she was supposed to start back over on the $Remo'10mg'blHQG$  once got to the $Remo'40mg'cUQJW$ .  Please advise

## 2013-08-29 NOTE — Telephone Encounter (Signed)
Pt notified and will use what she has at home before picking up the 40mg . Clemetine Marker, LPN

## 2013-09-03 ENCOUNTER — Encounter: Payer: Self-pay | Admitting: Sports Medicine

## 2013-09-03 ENCOUNTER — Ambulatory Visit (INDEPENDENT_AMBULATORY_CARE_PROVIDER_SITE_OTHER): Payer: Medicare Other | Admitting: Sports Medicine

## 2013-09-03 VITALS — BP 134/71 | HR 94 | Ht 66.0 in | Wt 178.0 lb

## 2013-09-03 DIAGNOSIS — M1610 Unilateral primary osteoarthritis, unspecified hip: Secondary | ICD-10-CM

## 2013-09-03 DIAGNOSIS — M169 Osteoarthritis of hip, unspecified: Secondary | ICD-10-CM

## 2013-09-03 DIAGNOSIS — M1710 Unilateral primary osteoarthritis, unspecified knee: Secondary | ICD-10-CM

## 2013-09-03 DIAGNOSIS — M1711 Unilateral primary osteoarthritis, right knee: Secondary | ICD-10-CM | POA: Insufficient documentation

## 2013-09-03 DIAGNOSIS — M1611 Unilateral primary osteoarthritis, right hip: Secondary | ICD-10-CM | POA: Insufficient documentation

## 2013-09-03 DIAGNOSIS — IMO0002 Reserved for concepts with insufficient information to code with codable children: Secondary | ICD-10-CM

## 2013-09-03 NOTE — Assessment & Plan Note (Signed)
Injection as above, if no better in one month we will proceed with viscous supplementation. X-rays.

## 2013-09-03 NOTE — Assessment & Plan Note (Addendum)
Injection as above. X-rays. Agrees to home health physical therapy. She desires [place].

## 2013-09-03 NOTE — Progress Notes (Signed)
   Subjective:    I'm seeing this patient as a consultation for:  Dr. Madilyn Fireman  CC: Knee pain and hip pain  HPI: Right Knee pain: Osteoarthritis, has not had any interventional treatment, pain is moderate, persistent, worse at the joint lines.  Right hip pain: Localized in the groin, worse with weightbearing, gelling, moderate, persistent. Has never had an injection. Uses naproxen daily.  Past medical history, Surgical history, Family history not pertinant except as noted below, Social history, Allergies, and medications have been entered into the medical record, reviewed, and no changes needed.   Review of Systems: No headache, visual changes, nausea, vomiting, diarrhea, constipation, dizziness, abdominal pain, skin rash, fevers, chills, night sweats, weight loss, swollen lymph nodes, body aches, joint swelling, muscle aches, chest pain, shortness of breath, mood changes, visual or auditory hallucinations.   Objective:   General: Well Developed, well nourished, and in no acute distress. Fairly debilitated-appearing, difficulty walking. Neuro/Psych: Alert and oriented x3, extra-ocular muscles intact, able to move all 4 extremities, sensation grossly intact. Skin: Warm and dry, no rashes noted.  Respiratory: Not using accessory muscles, speaking in full sentences, trachea midline.  Cardiovascular: Pulses palpable, no extremity edema. Abdomen: Does not appear distended. Right Knee: Mild effusion, tender to palpation at the joint lines. Right Hip: Decreased internal rotation to approximately 30, this reproduces pain in the groin.  Procedure: Real-time Ultrasound Guided Injection of right knee Device: GE Logiq E  Verbal informed consent obtained.  Time-out conducted.  Noted no overlying erythema, induration, or other signs of local infection.  Skin prepped in a sterile fashion.  Local anesthesia: Topical Ethyl chloride.  With sterile technique and under real time ultrasound guidance:   2 cc Kenalog 40, 4 cc lidocaine injected easily into the suprapatellar recess. Completed without difficulty  Pain immediately resolved suggesting accurate placement of the medication.  Advised to call if fevers/chills, erythema, induration, drainage, or persistent bleeding.  Images permanently stored and available for review in the ultrasound unit.  Impression: Technically successful ultrasound guided injection.  Procedure: Real-time Ultrasound Guided Injection of right femoral acetabular joint Device: GE Logiq E  Verbal informed consent obtained.  Time-out conducted.  Noted no overlying erythema, induration, or other signs of local infection.  Skin prepped in a sterile fashion.  Local anesthesia: Topical Ethyl chloride.  With sterile technique and under real time ultrasound guidance:  Joint appeared distorted due to significant degenerative change, spinal needle advanced to the femoral head/neck junction, 2 cc Kenalog 40, 4 cc lidocaine injected easily. Completed without difficulty  Pain immediately resolved suggesting accurate placement of the medication.  Advised to call if fevers/chills, erythema, induration, drainage, or persistent bleeding.  Images permanently stored and available for review in the ultrasound unit.  Impression: Technically successful ultrasound guided injection.  Impression and Recommendations:   This case required medical decision making of moderate complexity.

## 2013-09-04 ENCOUNTER — Ambulatory Visit: Payer: Medicare Other | Admitting: Sports Medicine

## 2013-09-06 ENCOUNTER — Telehealth: Payer: Self-pay | Admitting: *Deleted

## 2013-09-06 LAB — VITAMIN B1: Vitamin B1 (Thiamine): 80 nmol/L — ABNORMAL HIGH (ref 8–30)

## 2013-09-06 NOTE — Telephone Encounter (Signed)
Pt has stopped the CYmbalta when she started the Viibryd. Pt is still having SHOB and states she can't come in today but will schedule appt for tomorrow.  Oscar La, LPN

## 2013-09-06 NOTE — Telephone Encounter (Signed)
That would be very unusual and unlikely side effect of the medication. But I'm concerned she still feeling short of breath that she may need to be evaluated. Is she completely off of the Cymbalta?

## 2013-09-06 NOTE — Telephone Encounter (Signed)
Pt states that she has been on Viibryd for a little over a week and states yesterday she fell asleep and woke feeling like she is on fire and with [facility]. Informed pt not to take another until i have called back informing her what to do.  Oscar La, LPN

## 2013-09-07 ENCOUNTER — Other Ambulatory Visit: Payer: Self-pay | Admitting: Family Medicine

## 2013-09-07 ENCOUNTER — Ambulatory Visit: Payer: Medicare Other | Admitting: Family Medicine

## 2013-09-11 ENCOUNTER — Telehealth: Payer: Self-pay | Admitting: *Deleted

## 2013-09-11 NOTE — Telephone Encounter (Signed)
Physical therapist who is seeing patinet in home for PT was called by the patient this morning stating she was having some intense left thigh pain.  He went out to see her and done some soft tissue massage and direct mobilization on her quad and pt states that it feels much better.  PT states probably just some muscle spasms but wanted to let you know as well.  FYI

## 2013-09-13 ENCOUNTER — Ambulatory Visit: Payer: Medicare Other | Admitting: Family Medicine

## 2013-09-14 ENCOUNTER — Telehealth: Payer: Self-pay | Admitting: *Deleted

## 2013-09-14 MED ORDER — ORPHENADRINE CITRATE ER 100 MG PO TB12: 100.0000 mg | ORAL_TABLET | Freq: Two times a day (BID) | ORAL | Status: DC

## 2013-09-14 NOTE — Telephone Encounter (Signed)
I don't think a muscle relaxer will help but I will add Norflex as it is the least sedating.

## 2013-09-14 NOTE — Telephone Encounter (Signed)
Patient calls office and states shwe would like to get a muscle relaxer for her knee.  I sent you a note the other day on this patient that was from physical therapist at [place] that stated he had to go out and work with her quads due to having muscle spasms but never got a response back from you on that one. Clemetine Marker, LPN

## 2013-09-14 NOTE — Telephone Encounter (Signed)
Pt notified med sent to pharmacy. Clemetine Marker, LPN

## 2013-09-17 ENCOUNTER — Other Ambulatory Visit: Payer: Self-pay | Admitting: Family Medicine

## 2013-09-21 ENCOUNTER — Ambulatory Visit: Payer: Medicare Other | Admitting: Family Medicine

## 2013-09-24 ENCOUNTER — Ambulatory Visit (INDEPENDENT_AMBULATORY_CARE_PROVIDER_SITE_OTHER): Payer: Medicare Other | Admitting: Family Medicine

## 2013-09-24 ENCOUNTER — Encounter: Payer: Self-pay | Admitting: Family Medicine

## 2013-09-24 VITALS — BP 178/78 | HR 110 | Wt 178.0 lb

## 2013-09-24 DIAGNOSIS — F418 Other specified anxiety disorders: Secondary | ICD-10-CM

## 2013-09-24 DIAGNOSIS — R21 Rash and other nonspecific skin eruption: Secondary | ICD-10-CM

## 2013-09-24 DIAGNOSIS — B029 Zoster without complications: Secondary | ICD-10-CM

## 2013-09-24 DIAGNOSIS — F341 Dysthymic disorder: Secondary | ICD-10-CM

## 2013-09-24 MED ORDER — VALACYCLOVIR HCL 1 G PO TABS: 1000.0000 mg | ORAL_TABLET | Freq: Three times a day (TID) | ORAL | Status: DC

## 2013-09-24 MED ORDER — METOPROLOL SUCCINATE ER 25 MG PO TB24: ORAL_TABLET | ORAL | Status: DC

## 2013-09-24 MED ORDER — ALPRAZOLAM 0.25 MG PO TABS: ORAL_TABLET | ORAL | Status: DC

## 2013-09-24 MED ORDER — LIDOCAINE HCL 2 % EX GEL: 1.0000 "application " | Freq: Three times a day (TID) | CUTANEOUS | Status: DC | PRN

## 2013-09-24 MED ORDER — THYROID 60 MG PO TABS: ORAL_TABLET | ORAL | Status: DC

## 2013-09-24 NOTE — Progress Notes (Signed)
   Subjective:    Patient ID: Nicole Duffy, female    DOB: 11/12/1931, 80 y.o.   MRN: [id number]  HPI Rash on left buttock check and groin down to back of knee x 1 week. Painful and burning.  Worse the last 3 days. Has been itchey as well.  Has been scratching at it. No fever, chills or sweats.  Pain has kept her up at night but does have some baseline insomnia.   Mood - Doing well on the viibryd.  Hard to come off the cymbalta. Had a lot of pain.    Review of Systems     Objective:   Physical Exam  Constitutional: She appears well-developed and well-nourished.  Musculoskeletal:       Legs: vesciles in clusters on erythematous base in circles areas.           Assessment & Plan:  Rash - Most consistent with shingles or HSV.  Will tx with antiviral and topical lidocaine. Can take extra tramadol ( 2 tabs ) at bedtime if needed, until pain is under better control. We'll call with culture results once available. Call if any signs of superficial infection. Encouraged her not to scratch at it to avoid arterial infection.Marland Kitchen   Anxiety/Depression - well controlled on Viibryd. Seems to be doing very well and seems much more calm today.

## 2013-09-26 ENCOUNTER — Ambulatory Visit: Payer: Medicare Other | Admitting: Family Medicine

## 2013-09-26 ENCOUNTER — Telehealth: Payer: Self-pay

## 2013-09-26 ENCOUNTER — Other Ambulatory Visit: Payer: Self-pay | Admitting: Family Medicine

## 2013-09-26 LAB — HERPES SIMPLEX VIRUS CULTURE: ORGANISM ID, BACTERIA: NOT DETECTED

## 2013-09-26 NOTE — Telephone Encounter (Signed)
Nicole Duffy's daughter called to see if shingles is contagious. I advised - people who have never had chickenpox and have not been vaccinated against the disease can develop chickenpox if exposed to the virus. Also, in women who are pregnant it's possible to infect the unborn child as well. Limit contact with others for at least three days after start antiviral medication.

## 2013-10-01 ENCOUNTER — Encounter: Payer: Self-pay | Admitting: Sports Medicine

## 2013-10-01 ENCOUNTER — Ambulatory Visit (INDEPENDENT_AMBULATORY_CARE_PROVIDER_SITE_OTHER): Payer: Medicare Other | Admitting: Sports Medicine

## 2013-10-01 VITALS — BP 140/74 | HR 76 | Ht 66.0 in | Wt 179.0 lb

## 2013-10-01 DIAGNOSIS — IMO0002 Reserved for concepts with insufficient information to code with codable children: Secondary | ICD-10-CM

## 2013-10-01 DIAGNOSIS — M1711 Unilateral primary osteoarthritis, right knee: Secondary | ICD-10-CM

## 2013-10-01 DIAGNOSIS — M545 Low back pain, unspecified: Secondary | ICD-10-CM

## 2013-10-01 DIAGNOSIS — M1611 Unilateral primary osteoarthritis, right hip: Secondary | ICD-10-CM

## 2013-10-01 DIAGNOSIS — M25511 Pain in right shoulder: Secondary | ICD-10-CM | POA: Insufficient documentation

## 2013-10-01 DIAGNOSIS — M1710 Unilateral primary osteoarthritis, unspecified knee: Secondary | ICD-10-CM

## 2013-10-01 DIAGNOSIS — M25519 Pain in unspecified shoulder: Secondary | ICD-10-CM

## 2013-10-01 NOTE — Assessment & Plan Note (Signed)
Pain is clinically impingement as well as a.c. joint osteoarthritis. Home health physical therapy, return in one month, injection if no better.

## 2013-10-01 NOTE — Assessment & Plan Note (Signed)
Continues to be resolved after injection and home health physical therapy.

## 2013-10-01 NOTE — Assessment & Plan Note (Signed)
Pain is predominantly localized over the right sacroiliac joint. Home rehabilitation exercises, she will also get some home health PT. Return in one month, we can consider injection of her SI joint if no better.

## 2013-10-01 NOTE — Assessment & Plan Note (Signed)
50% improved after steroid injection. Starting viscosupplementation. Return in one week for Supartz injection #2.

## 2013-10-01 NOTE — Progress Notes (Signed)
  Subjective:    CC: Follow up  HPI: Right hip osteoarthritis: Pain-free after injection.  Right-sided low back pain: Localized to the sacroiliac joint. Has not yet done any home rehabilitation exercises. She is taking narcotics.  Left knee pain: Post arthroplasty, understands that she should follow up with her surgeon regarding this.  Right knee osteoarthritis: 50% response to steroid injection, unfortunately continues to have some pain at the joint lines, would like to proceed with the next step.  Right shoulder pain: Worse over the deltoid, and acromioclavicular joint, worse with overhead activities.  Past medical history, Surgical history, Family history not pertinant except as noted below, Social history, Allergies, and medications have been entered into the medical record, reviewed, and no changes needed.   Review of Systems: No fevers, chills, night sweats, weight loss, chest pain, or shortness of breath.   Objective:    General: Well Developed, well nourished, and in no acute distress.  Neuro: Alert and oriented x3, extra-ocular muscles intact, sensation grossly intact.  HEENT: Normocephalic, atraumatic, pupils equal round reactive to light, neck supple, no masses, no lymphadenopathy, thyroid nonpalpable.  Skin: Warm and dry, no rashes. Cardiac: Regular rate and rhythm, no murmurs rubs or gallops, no lower extremity edema.  Respiratory: Clear to auscultation bilaterally. Not using accessory muscles, speaking in full sentences. Shoulder: Inspection reveals no abnormalities, atrophy or asymmetry. Moderate tenderness over AC joint. ROM is full in all planes. Rotator cuff strength normal throughout. Positive Neer and Hawkin's tests, empty can sign. Speeds and Yergason's tests normal. No labral pathology noted with negative Obrien's, negative clunk and good stability. Normal scapular function observed. No painful arc and no drop arm sign. No apprehension sign Back Exam:    Inspection: Unremarkable  Motion: Flexion 45 deg, Extension 45 deg, Side Bending to 45 deg bilaterally,  Rotation to 45 deg bilaterally  SLR laying: Negative  XSLR laying: Negative  Palpable tenderness: Right-sided sacroiliac joint. FABER: negative. Sensory change: Gross sensation intact to all lumbar and sacral dermatomes.  Reflexes: 2+ at both patellar tendons, 2+ at achilles tendons, Babinski's downgoing.  Strength at foot  Plantar-flexion: 5/5 Dorsi-flexion: 5/5 Eversion: 5/5 Inversion: 5/5  Leg strength  Quad: 5/5 Hamstring: 5/5 Hip flexor: 5/5 Hip abductors: 5/5  Gait unremarkable.  Procedure: Real-time Ultrasound Guided Injection of right knee Device: GE Logiq E  Verbal informed consent obtained.  Time-out conducted.  Noted no overlying erythema, induration, or other signs of local infection.  Skin prepped in a sterile fashion.  Local anesthesia: Topical Ethyl chloride.  With sterile technique and under real time ultrasound guidance:  25 mg/2.5 mL of Supartz (sodium hyaluronate) in a prefilled syringe was injected easily into the knee through a 22-gauge needle. Completed without difficulty  Pain immediately resolved suggesting accurate placement of the medication.  Advised to call if fevers/chills, erythema, induration, drainage, or persistent bleeding.  Images permanently stored and available for review in the ultrasound unit.  Impression: Technically successful ultrasound guided injection.  Impression and Recommendations:

## 2013-10-02 LAB — VIRAL CULTURE VIRC: ORGANISM ID, BACTERIA: DETECTED

## 2013-10-09 ENCOUNTER — Ambulatory Visit (INDEPENDENT_AMBULATORY_CARE_PROVIDER_SITE_OTHER): Payer: Medicare Other | Admitting: Sports Medicine

## 2013-10-09 ENCOUNTER — Encounter: Payer: Self-pay | Admitting: Sports Medicine

## 2013-10-09 VITALS — BP 146/77 | HR 91 | Ht 66.0 in | Wt 178.0 lb

## 2013-10-09 DIAGNOSIS — M1710 Unilateral primary osteoarthritis, unspecified knee: Secondary | ICD-10-CM

## 2013-10-09 DIAGNOSIS — M1611 Unilateral primary osteoarthritis, right hip: Secondary | ICD-10-CM

## 2013-10-09 DIAGNOSIS — M545 Low back pain, unspecified: Secondary | ICD-10-CM

## 2013-10-09 DIAGNOSIS — M25519 Pain in unspecified shoulder: Secondary | ICD-10-CM

## 2013-10-09 DIAGNOSIS — M25511 Pain in right shoulder: Secondary | ICD-10-CM

## 2013-10-09 DIAGNOSIS — M1711 Unilateral primary osteoarthritis, right knee: Secondary | ICD-10-CM

## 2013-10-09 DIAGNOSIS — IMO0002 Reserved for concepts with insufficient information to code with codable children: Secondary | ICD-10-CM

## 2013-10-09 NOTE — Assessment & Plan Note (Signed)
Clinically represents right sacroiliac joint dysfunction. Significantly with home health PT, she only has a few sessions less why we'll set her up with our physical therapy department here to take over.

## 2013-10-09 NOTE — Assessment & Plan Note (Signed)
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 Continue physical therapy for a solid month, if persistent pain when she returns we can do a subacromial and acromioclavicular joint injection.

## 2013-10-09 NOTE — Assessment & Plan Note (Signed)
Supartz injection #2 of 5 given today. Return in one week.

## 2013-10-09 NOTE — Progress Notes (Signed)
  Subjective:    CC: Followup  HPI: Right knee osteoarthritis : due for Supartz injection #2.  Right shoulder pain: Still working with physical therapy, home health.  SI joint dysfunction: Improved significantly with home health physical therapy.  Past medical history, Surgical history, Family history not pertinant except as noted below, Social history, Allergies, and medications have been entered into the medical record, reviewed, and no changes needed.   Review of Systems: No fevers, chills, night sweats, weight loss, chest pain, or shortness of breath.   Objective:    General: Well Developed, well nourished, and in no acute distress.  Neuro: Alert and oriented x3, extra-ocular muscles intact, sensation grossly intact.  HEENT: Normocephalic, atraumatic, pupils equal round reactive to light, neck supple, no masses, no lymphadenopathy, thyroid nonpalpable.  Skin: Warm and dry, no rashes. Cardiac: Regular rate and rhythm, no murmurs rubs or gallops, no lower extremity edema.  Respiratory: Clear to auscultation bilaterally. Not using accessory muscles, speaking in full sentences.  Procedure: Real-time Ultrasound Guided Injection of Right knee Device: GE Logiq E  Verbal informed consent obtained.  Time-out conducted.  Noted no overlying erythema, induration, or other signs of local infection.  Skin prepped in a sterile fashion.  Local anesthesia: Topical Ethyl chloride.  With sterile technique and under real time ultrasound guidance:  25 mg/2.5 mL of Supartz (sodium hyaluronate) in a prefilled syringe was injected easily into the knee through a 22-gauge needle. Completed without difficulty  Pain immediately resolved suggesting accurate placement of the medication.  Advised to call if fevers/chills, erythema, induration, drainage, or persistent bleeding.  Images permanently stored and available for review in the ultrasound unit.  Impression: Technically successful ultrasound guided  injection.  Impression and Recommendations:

## 2013-10-09 NOTE — Assessment & Plan Note (Signed)
Continue to be resolved after femoral acetabular joint injection as well as physical therapy.

## 2013-10-11 ENCOUNTER — Telehealth: Payer: Self-pay | Admitting: *Deleted

## 2013-10-11 DIAGNOSIS — R300 Dysuria: Secondary | ICD-10-CM

## 2013-10-11 MED ORDER — CIPROFLOXACIN HCL 250 MG PO TABS: ORAL_TABLET | ORAL | Status: AC

## 2013-10-11 NOTE — Telephone Encounter (Signed)
Pt called and would like an rx sent to her pharmacy for dysuria. She was recently Dx with shingles around her pubic area and buttock. She is unable to come in today due to her family being out of town and she has no other means of transportation. I informed her that I would have to send this note to one of our other providers in the office and call her back with the decision. Pt voiced understanding.Teddy Spike

## 2013-10-11 NOTE — Telephone Encounter (Signed)
Given age and transportation issues I've sent in cipro to [facility] but I'd advise she have an office visit if not improving in 48 hours.

## 2013-10-12 NOTE — Telephone Encounter (Signed)
No vm.Nicole Duffy L Nicole Duffy

## 2013-10-16 ENCOUNTER — Ambulatory Visit: Payer: Medicare Other | Admitting: Sports Medicine

## 2013-10-17 ENCOUNTER — Other Ambulatory Visit: Payer: Self-pay | Admitting: Family Medicine

## 2013-10-18 ENCOUNTER — Ambulatory Visit (INDEPENDENT_AMBULATORY_CARE_PROVIDER_SITE_OTHER): Payer: Medicare Other | Admitting: Sports Medicine

## 2013-10-18 ENCOUNTER — Telehealth: Payer: Self-pay | Admitting: *Deleted

## 2013-10-18 ENCOUNTER — Encounter: Payer: Self-pay | Admitting: Sports Medicine

## 2013-10-18 VITALS — BP 128/50 | HR 85 | Wt 181.0 lb

## 2013-10-18 DIAGNOSIS — M545 Low back pain, unspecified: Secondary | ICD-10-CM

## 2013-10-18 DIAGNOSIS — M1711 Unilateral primary osteoarthritis, right knee: Secondary | ICD-10-CM

## 2013-10-18 DIAGNOSIS — M1710 Unilateral primary osteoarthritis, unspecified knee: Secondary | ICD-10-CM

## 2013-10-18 DIAGNOSIS — IMO0002 Reserved for concepts with insufficient information to code with codable children: Secondary | ICD-10-CM

## 2013-10-18 NOTE — Assessment & Plan Note (Signed)
Clinically represents right sacroiliac joint dysfunction. She is finishing home health PT. She continues to have pain so we are going to obtain an MRI of the lumbar spine. Return to go over results

## 2013-10-18 NOTE — Assessment & Plan Note (Addendum)
Supartz injection #3 of 5 into the right knee. Return in one week for injection 4

## 2013-10-18 NOTE — Progress Notes (Signed)
  Subjective:    CC: Followup  HPI: Right knee osteoarthritis : due for Supartz injection #3.  Right shoulder pain: Improved with physical therapy/home health.  SI joint dysfunction: Improved significantly with home health physical therapy.  Pt is now finished. She continues to have pain, she localizes around the SI joint.  Past medical history, Surgical history, Family history not pertinant except as noted below, Social history, Allergies, and medications have been entered into the medical record, reviewed, and no changes needed.   Review of Systems: No fevers, chills, night sweats, weight loss, chest pain, or shortness of breath.   Objective:    General: Well Developed, well nourished, and in no acute distress.  Neuro: Alert and oriented x3, extra-ocular muscles intact, sensation grossly intact.  HEENT: Normocephalic, atraumatic, pupils equal round reactive to light, neck supple, no masses, no lymphadenopathy, thyroid nonpalpable.  Skin: Warm and dry, no rashes. Cardiac: Regular rate and rhythm, no murmurs rubs or gallops, no lower extremity edema.  Respiratory: Clear to auscultation bilaterally. Not using accessory muscles, speaking in full sentences.  Procedure: Real-time Ultrasound Guided Injection of Right knee Device: GE Logiq E  Verbal informed consent obtained.  Time-out conducted.  Noted no overlying erythema, induration, or other signs of local infection.  Skin prepped in a sterile fashion.  Local anesthesia: Topical Ethyl chloride.  With sterile technique and under real time ultrasound guidance:  25 mg/2.5 mL of Supartz (sodium hyaluronate) in a prefilled syringe was injected easily into the knee through a 22-gauge needle. Completed without difficulty  Pain immediately resolved suggesting accurate placement of the medication.  Advised to call if fevers/chills, erythema, induration, drainage, or persistent bleeding.  Images permanently stored and available for review in  the ultrasound unit.  Impression: Technically successful ultrasound guided injection.  Impression and Recommendations:

## 2013-10-18 NOTE — Telephone Encounter (Signed)
PA obtained for MRI Lumbar SPine w/o contrast. Auth # [id number]. Exp. 11-16-13.  Oscar La, LPN

## 2013-10-24 ENCOUNTER — Ambulatory Visit (INDEPENDENT_AMBULATORY_CARE_PROVIDER_SITE_OTHER): Payer: Medicare Other

## 2013-10-24 DIAGNOSIS — M47817 Spondylosis without myelopathy or radiculopathy, lumbosacral region: Secondary | ICD-10-CM

## 2013-10-24 DIAGNOSIS — M4310 Spondylolisthesis, site unspecified: Secondary | ICD-10-CM

## 2013-10-24 DIAGNOSIS — M545 Low back pain, unspecified: Secondary | ICD-10-CM

## 2013-10-25 ENCOUNTER — Ambulatory Visit: Payer: Medicare Other | Admitting: Sports Medicine

## 2013-10-29 ENCOUNTER — Ambulatory Visit (INDEPENDENT_AMBULATORY_CARE_PROVIDER_SITE_OTHER): Payer: Medicare Other | Admitting: Sports Medicine

## 2013-10-29 VITALS — BP 139/74 | HR 65 | Ht 66.0 in | Wt 183.0 lb

## 2013-10-29 DIAGNOSIS — M545 Low back pain, unspecified: Secondary | ICD-10-CM

## 2013-10-29 DIAGNOSIS — M1711 Unilateral primary osteoarthritis, right knee: Secondary | ICD-10-CM

## 2013-10-29 DIAGNOSIS — M1710 Unilateral primary osteoarthritis, unspecified knee: Secondary | ICD-10-CM

## 2013-10-29 DIAGNOSIS — IMO0002 Reserved for concepts with insufficient information to code with codable children: Secondary | ICD-10-CM

## 2013-10-29 NOTE — Assessment & Plan Note (Signed)
Multilevel findings in the lumbar spine. We will place this on the back burner or until he is finished.

## 2013-10-29 NOTE — Assessment & Plan Note (Signed)
Injection #4 given today. Return in a week for #5.

## 2013-10-29 NOTE — Progress Notes (Signed)
  Subjective:    CC: Followup  HPI: Right knee osteoarthritis : due for Supartz injection #4.  SI joint dysfunction: Improved significantly with home health physical therapy.  Pt is now finished. She continues to have pain, she localizes around the SI joint. MRI results be dictated below.  Past medical history, Surgical history, Family history not pertinant except as noted below, Social history, Allergies, and medications have been entered into the medical record, reviewed, and no changes needed.   Review of Systems: No fevers, chills, night sweats, weight loss, chest pain, or shortness of breath.   Objective:    General: Well Developed, well nourished, and in no acute distress.  Neuro: Alert and oriented x3, extra-ocular muscles intact, sensation grossly intact.  HEENT: Normocephalic, atraumatic, pupils equal round reactive to light, neck supple, no masses, no lymphadenopathy, thyroid nonpalpable.  Skin: Warm and dry, no rashes. Cardiac: Regular rate and rhythm, no murmurs rubs or gallops, no lower extremity edema.  Respiratory: Clear to auscultation bilaterally. Not using accessory muscles, speaking in full sentences.  Procedure: Real-time Ultrasound Guided Injection of Right knee Device: GE Logiq E  Verbal informed consent obtained.  Time-out conducted.  Noted no overlying erythema, induration, or other signs of local infection.  Skin prepped in a sterile fashion.  Local anesthesia: Topical Ethyl chloride.  With sterile technique and under real time ultrasound guidance:  25 mg/2.5 mL of Supartz (sodium hyaluronate) in a prefilled syringe was injected easily into the knee through a 22-gauge needle. Completed without difficulty  Pain immediately resolved suggesting accurate placement of the medication.  Advised to call if fevers/chills, erythema, induration, drainage, or persistent bleeding.  Images permanently stored and available for review in the ultrasound unit.  Impression:  Technically successful ultrasound guided injection.  MRI of the lumbar spine shows multilevel spondylosis with lumbar spinal stenosis with right-sided foraminal stenosis at the L1-L2 level, as well as lumbar spinal stenosis at the L3-L4 level.  Impression and Recommendations:

## 2013-11-05 ENCOUNTER — Ambulatory Visit (INDEPENDENT_AMBULATORY_CARE_PROVIDER_SITE_OTHER): Payer: Medicare Other | Admitting: Sports Medicine

## 2013-11-05 ENCOUNTER — Other Ambulatory Visit: Payer: Self-pay | Admitting: Family Medicine

## 2013-11-05 ENCOUNTER — Encounter: Payer: Self-pay | Admitting: Sports Medicine

## 2013-11-05 VITALS — BP 130/74 | HR 75 | Ht 66.5 in | Wt 184.0 lb

## 2013-11-05 DIAGNOSIS — M1710 Unilateral primary osteoarthritis, unspecified knee: Secondary | ICD-10-CM

## 2013-11-05 DIAGNOSIS — M1711 Unilateral primary osteoarthritis, right knee: Secondary | ICD-10-CM

## 2013-11-05 NOTE — Progress Notes (Signed)
  Procedure: Real-time Ultrasound Guided Injection of right knee Device: GE Logiq E  Verbal informed consent obtained.  Time-out conducted.  Noted no overlying erythema, induration, or other signs of local infection.  Skin prepped in a sterile fashion.  Local anesthesia: Topical Ethyl chloride.  With sterile technique and under real time ultrasound guidance:  25 mg/2.5 mL of Supartz (sodium hyaluronate) in a prefilled syringe was injected easily into the knee through a 22-gauge needle.  Completed without difficulty  Pain immediately resolved suggesting accurate placement of the medication.  Advised to call if fevers/chills, erythema, induration, drainage, or persistent bleeding.  Images permanently stored and available for review in the ultrasound unit.  Impression: Technically successful ultrasound guided injection. 

## 2013-11-05 NOTE — Assessment & Plan Note (Signed)
Injection number 5 of 5 into the right knee. She is now pain-free. Return as needed.

## 2013-11-12 ENCOUNTER — Other Ambulatory Visit: Payer: Self-pay | Admitting: Cardiology

## 2013-11-12 ENCOUNTER — Other Ambulatory Visit: Payer: Self-pay | Admitting: Family Medicine

## 2013-11-19 ENCOUNTER — Other Ambulatory Visit: Payer: Self-pay | Admitting: *Deleted

## 2013-11-19 DIAGNOSIS — R51 Headache: Secondary | ICD-10-CM

## 2013-11-27 ENCOUNTER — Encounter: Payer: Self-pay | Admitting: Family Medicine

## 2013-11-27 ENCOUNTER — Ambulatory Visit (INDEPENDENT_AMBULATORY_CARE_PROVIDER_SITE_OTHER): Payer: Medicare Other | Admitting: Family Medicine

## 2013-11-27 VITALS — BP 138/83 | HR 112 | Wt 185.0 lb

## 2013-11-27 DIAGNOSIS — G8929 Other chronic pain: Secondary | ICD-10-CM

## 2013-11-27 DIAGNOSIS — B0229 Other postherpetic nervous system involvement: Secondary | ICD-10-CM

## 2013-11-27 DIAGNOSIS — M545 Low back pain, unspecified: Secondary | ICD-10-CM

## 2013-11-27 DIAGNOSIS — F411 Generalized anxiety disorder: Secondary | ICD-10-CM

## 2013-11-27 DIAGNOSIS — Z79899 Other long term (current) drug therapy: Secondary | ICD-10-CM

## 2013-11-27 LAB — POCT URINALYSIS DIPSTICK
Bilirubin, UA: NEGATIVE
Blood, UA: NEGATIVE
GLUCOSE UA: NEGATIVE
KETONES UA: NEGATIVE
Nitrite, UA: NEGATIVE
PROTEIN UA: 100
Spec Grav, UA: 1.015
Urobilinogen, UA: 0.2
pH, UA: 7

## 2013-11-27 MED ORDER — PREGABALIN 75 MG PO CAPS: 75.0000 mg | ORAL_CAPSULE | Freq: Every day | ORAL | Status: DC

## 2013-11-27 NOTE — Progress Notes (Signed)
   Subjective:    Patient ID: Nicole Duffy, female    DOB: [birth year], 80 y.o.   MRN: [id number]  HPI Your for medication review. Her daughter is concerned that she may be taking extra Xanax out of the bottle instead of taking it out of the prefilled container that her daughter-in-law sets up for her. She's noticed that she seems more jittery and anxious and confused. She refill her Xanax about a week early which means there about 14 tablets missing. The patient herself so she doesn't know where the extra medication went.  Feels like her skin is in on fire like when she had shingles.  It is keeping her awake at night.  She feels nauseated with it.  Then the buttock region but also on her arms. She's not had the shingles outbreak on her arms but that's where she's also feeling the pain. Ciardi takes tramadol twice a day and this is not really helping much.  Chronic low back pain-She's been using her tramadol twice a day.  She has not seen any recurrence of rash. She says Tylenol he felt really help her symptoms. Review of Systems     Objective:   Physical Exam  Constitutional: She is oriented to person, place, and time. She appears well-developed and well-nourished.  HENT:  Head: Normocephalic and atraumatic.  Eyes: Conjunctivae and EOM are normal.  Cardiovascular: Normal rate.   Pulmonary/Chest: Effort normal.  Neurological: She is alert and oriented to person, place, and time.  Skin: Skin is dry. No pallor.  Psychiatric: She has a normal mood and affect. Her behavior is normal.          Assessment & Plan:  Medication counseling/review-discussed that it might be better if her daughter-in-law took the medications home that are not in the prefilled container so that she is less likely to open them and take medications now. I also discussed with her making sure that no one else is coming into the home that might be stealing or taking medications.  Post herpetic neuralgia - we discussed  different options. Actually think should be good candidate for Lyrica which is FDA approved for postherpetic neuralgia. We'll start with 75 mg daily. Too sedating and we can start with a lower dose. Followup in one month to make sure that she's doing well and adjust the dose as needed.  Chronic low back pain-discussed the importance of using Tylenol as her baseline for pain control as long as she is taking appropriate doses. No more than 3 g in one day. Then using her tramadol as needed. It sounds like now she's taking it scheduled twice a day. I'm hoping that with the addition of Lyrica she may not have to use it as often but we will have to see. For now okay to fill 60 tabs per month.

## 2013-11-29 LAB — URINE CULTURE

## 2013-12-03 DIAGNOSIS — D649 Anemia, unspecified: Secondary | ICD-10-CM

## 2013-12-03 DIAGNOSIS — I10 Essential (primary) hypertension: Secondary | ICD-10-CM

## 2013-12-03 DIAGNOSIS — R29898 Other symptoms and signs involving the musculoskeletal system: Secondary | ICD-10-CM

## 2013-12-03 DIAGNOSIS — R269 Unspecified abnormalities of gait and mobility: Secondary | ICD-10-CM

## 2013-12-04 ENCOUNTER — Other Ambulatory Visit: Payer: Self-pay | Admitting: Family Medicine

## 2013-12-12 ENCOUNTER — Other Ambulatory Visit: Payer: Self-pay | Admitting: Family Medicine

## 2013-12-27 ENCOUNTER — Encounter: Payer: Self-pay | Admitting: Family Medicine

## 2013-12-27 ENCOUNTER — Ambulatory Visit (INDEPENDENT_AMBULATORY_CARE_PROVIDER_SITE_OTHER): Payer: Medicare Other | Admitting: Family Medicine

## 2013-12-27 VITALS — BP 138/66 | HR 85 | Wt 187.0 lb

## 2013-12-27 DIAGNOSIS — B0229 Other postherpetic nervous system involvement: Secondary | ICD-10-CM

## 2013-12-27 DIAGNOSIS — F411 Generalized anxiety disorder: Secondary | ICD-10-CM

## 2013-12-27 NOTE — Progress Notes (Signed)
   Subjective:    Patient ID: Nicole Duffy, female    DOB: 1932/01/01, 80 y.o.   MRN: [id number]  HPI Post herpetic neuralgia. She wants to come off the lyrica. Says it has been causing HA, nausea, and loose stools. She denies any vomiting. She had actually complained of nausea during her last office visit before the Lyrica was started. She says that the burning sensation on her side has resolved and she would like to come off of it.  Followup anxiety-she wants to have 5 extra tabs of Xanax per month. She takes it twice a day. She did have a panic attack but today while she was in the beauty shop  and had to take 2 tabs. She is worried about having another panic attack and having to take extra and being out early. Her daughter-in-law who is here with her today has been monitoring her intake carefully.  Review of Systems     Objective:   Physical Exam  Constitutional: She is oriented to person, place, and time. She appears well-developed and well-nourished.  HENT:  Head: Normocephalic and atraumatic.  Eyes: Conjunctivae and EOM are normal.  Cardiovascular: Normal rate.   Pulmonary/Chest: Effort normal.  Neurological: She is alert and oriented to person, place, and time.  Skin: Skin is dry. No pallor.  Psychiatric: She has a normal mood and affect. Her behavior is normal.          Assessment & Plan:  Post herpetic neuralgia. Will stop the lyrica. At this point she's no longer experiencing symptoms I think it's reasonable to stop it and she's getting significant side effects from the medication. She wanted to know if the burning sensation will come back. It's difficult to say we will just have to see when she stops the Lyrica.  Anxiety -  we discussed that she really needs to be weaning off of her morning dose of Xanax. That is why we started the [place] which actually think is working very well for her. We have finally found something that has helped. I explained her that she needs to  be taking this in the morning and then she can continue her Xanax in the evening to help her sleep. That we'll then leave for 30 extra tabs per month to take as needed for panic attacks only. We also discussed the importance of therapy or counseling in helping her deal with her high anxiety levels. Initially she did not want to consider this at the end of the visit that she was open to it. Her daughter-in-law said she would be happy to drive her.   Time spent 30 minutes, greater than 50% time spent counseling about anxiety and her medications and the importance of weaning her off of Xanax.

## 2014-01-07 ENCOUNTER — Other Ambulatory Visit: Payer: Self-pay | Admitting: Family Medicine

## 2014-01-08 ENCOUNTER — Other Ambulatory Visit: Payer: Self-pay | Admitting: Family Medicine

## 2014-01-29 ENCOUNTER — Ambulatory Visit (INDEPENDENT_AMBULATORY_CARE_PROVIDER_SITE_OTHER): Payer: Medicare Other | Admitting: Family Medicine

## 2014-01-29 ENCOUNTER — Encounter: Payer: Self-pay | Admitting: Family Medicine

## 2014-01-29 ENCOUNTER — Ambulatory Visit: Payer: Medicare Other | Admitting: Family Medicine

## 2014-01-29 VITALS — BP 138/70 | HR 88 | Temp 98.0°F

## 2014-01-29 DIAGNOSIS — R110 Nausea: Secondary | ICD-10-CM

## 2014-01-29 DIAGNOSIS — B0229 Other postherpetic nervous system involvement: Secondary | ICD-10-CM

## 2014-01-29 DIAGNOSIS — R1084 Generalized abdominal pain: Secondary | ICD-10-CM

## 2014-01-29 MED ORDER — NORTRIPTYLINE HCL 25 MG PO CAPS: 25.0000 mg | ORAL_CAPSULE | Freq: Every day | ORAL | Status: DC

## 2014-01-29 NOTE — Progress Notes (Signed)
   Subjective:    Patient ID: Nicole Duffy, female    DOB: [birth year], 80 y.o.   MRN: [id number]  HPI Constantly have constant abodminal pain. She feels nauseated all the time. Hx of GI ulcrs.  She is currently taking Protonix.  No change in BM recently.  No vomiting.  Her daugher in law says she has been eating a lot of greasy foods.    Post herpetic neuralgia is returning. No recurrence of the rash.  She was really doing well on Lyrica but was having some S.E so once her sxs were under control, we discontinued the Lyrica.  Her pain bothers her more at night. No real alleviating or worsening factors.    Requesting referral for dermatology for spot on the left foot.  No prior history of skin cancer.    Review of Systems     Objective:   Physical Exam  Constitutional: She is oriented to person, place, and time. She appears well-developed and well-nourished.  HENT:  Head: Normocephalic and atraumatic.  Cardiovascular: Normal rate, regular rhythm and normal heart sounds.   Pulmonary/Chest: Effort normal and breath sounds normal.  Neurological: She is alert and oriented to person, place, and time.  Skin: Skin is warm and dry.  Psychiatric: She has a normal mood and affect. Her behavior is normal.          Assessment & Plan:  Gen abdominal pain - we discussed workup that would includse [place] of GB and liver.  Possible H pylori breath teseting.  Possible endoscpy for gastritis vs ulcers.  She is already on PPI.  She says she doesn't want w/u or evaluation at this time. Enouraged her to think about it .   Post herpetic neuralgia - sxs are recurrent. Raassured her that less thatn 6% will have sxs 12 months out.  Will try TCA. Nortryptiline is covered on her insurance. F/U in 6-8weeks and see if helping.  Start with one tab and can increase to 2 if needed.    Skin lesion on foot - refer to dermatology

## 2014-02-05 ENCOUNTER — Other Ambulatory Visit: Payer: Self-pay | Admitting: Family Medicine

## 2014-02-07 ENCOUNTER — Ambulatory Visit: Payer: Medicare Other | Admitting: Family Medicine

## 2014-02-07 DIAGNOSIS — Z0289 Encounter for other administrative examinations: Secondary | ICD-10-CM

## 2014-02-19 ENCOUNTER — Encounter: Payer: Self-pay | Admitting: Family Medicine

## 2014-02-19 ENCOUNTER — Ambulatory Visit (INDEPENDENT_AMBULATORY_CARE_PROVIDER_SITE_OTHER): Payer: Medicare Other | Admitting: Family Medicine

## 2014-02-19 VITALS — BP 140/76 | HR 107 | Temp 98.2°F | Ht 66.0 in | Wt 191.0 lb

## 2014-02-19 DIAGNOSIS — R21 Rash and other nonspecific skin eruption: Secondary | ICD-10-CM

## 2014-02-19 DIAGNOSIS — F329 Major depressive disorder, single episode, unspecified: Secondary | ICD-10-CM

## 2014-02-19 DIAGNOSIS — B0229 Other postherpetic nervous system involvement: Secondary | ICD-10-CM

## 2014-02-19 DIAGNOSIS — F32A Depression, unspecified: Secondary | ICD-10-CM

## 2014-02-19 NOTE — Addendum Note (Signed)
Addended by: Narda Rutherford on: 02/19/2014 12:05 PM   Modules accepted: Orders

## 2014-02-19 NOTE — Progress Notes (Signed)
   Subjective:    Patient ID: Nicole Duffy, female    DOB: [birth year], 80 y.o.   MRN: [id number]  HPI Rash on right lower leg for a few days. Did use a new soap.  No itching or rash. Says getting a little larger.  No fever, or cihlls or sweats.  We did start a new medication about 2 weeks ago, nortriptyline. No chest pain short of breath or cough.  Depression - Says the amitryptiline is wokring really well. She is sleeping better and her anxiety is much improved. Still feels some tired in the AM. Says her shingles pain has been well controlled.  She denies any side effects.  Also has a lesion on the top of the left upper foot. Her podiatrist who recommended that she speak to me about potentially doing a biopsy. If it does not bother her. She has not noticed any recent changes in size.   Review of Systems     Objective:   Physical Exam  Constitutional: She appears well-developed and well-nourished.  HENT:  Head: Normocephalic and atraumatic.  Skin: Skin is warm and dry. Rash noted.  Lesion on the dorsum of the left foot is most consistent with an approximately 1 cm seborrheic keratosis but does have a black spot on the chest that is concerning. No surrounding erythema. The dorsum of the left lower extremity just below the calf muscle there is a 3 x 6 cm erythematous patch. It does blanch with pressure. She also has a few scattered petechiae on the feet bilaterally. No other rash on the torso or upper extremities.  Psychiatric: She has a normal mood and affect. Her behavior is normal.          Assessment & Plan:  Rash-unclear etiology. Skin scraping performed of the posterior right leg. We'll call with results tomorrow. The rash did blanch says it certainly could still be a drug rash. Dose than usual but it's just on her lower chin these and/or else. She did start any medication and affect are doing fantastic on atenolol but hesitant to stop it unless I really feel like it's the truck  causing the symptoms.  Depression-much improved after the addition of nortriptyline. It also seems to be helping her postherpetic neuralgia as well as improving her sleep quality.  Skin lesion on the dorsum of the left foot-is consistent with a seborrheic keratosis but has a black spot on the day that is concerning. Recommend a punch biopsy for further evaluation. Patient to schedule at her convenience.

## 2014-02-21 ENCOUNTER — Telehealth: Payer: Self-pay | Admitting: *Deleted

## 2014-02-21 LAB — KOH PREP: RESULT - KOH: NONE SEEN

## 2014-02-21 NOTE — Telephone Encounter (Signed)
Nicole Duffy's daughter/law called this morning asking regarding Nicole Duffy screen that was done Tuesday in office on her right lower leg. She asked regarding results and I told her that they were not back yet and she said that the rash is spreading on her leg. I told her that we would call as soon as the results came back. Then she said that Nicole Duffy awoke this morning with chest heaviness and arm pain/weakness. I expressed to her daughter/law that this was something that needed to be evaluated at the ER. She said that she thought it was a panic attack and I explained to her that this was not a normal symptom to have and if she woke like this that it certainly needed to further evaluated. She verbalized understanding but I am unsure if she is going to follow through with medical care. Nicole Duffy, CMA

## 2014-02-25 ENCOUNTER — Other Ambulatory Visit: Payer: Self-pay | Admitting: Family Medicine

## 2014-02-26 ENCOUNTER — Other Ambulatory Visit: Payer: Self-pay | Admitting: Family Medicine

## 2014-03-05 ENCOUNTER — Other Ambulatory Visit: Payer: Self-pay | Admitting: Family Medicine

## 2014-03-07 ENCOUNTER — Other Ambulatory Visit: Payer: Self-pay | Admitting: Family Medicine

## 2014-03-08 ENCOUNTER — Ambulatory Visit (INDEPENDENT_AMBULATORY_CARE_PROVIDER_SITE_OTHER): Payer: Medicare Other | Admitting: Family Medicine

## 2014-03-08 ENCOUNTER — Encounter: Payer: Self-pay | Admitting: Family Medicine

## 2014-03-08 VITALS — BP 149/73 | HR 90 | Temp 98.1°F | Ht 66.0 in | Wt 191.0 lb

## 2014-03-08 DIAGNOSIS — Z23 Encounter for immunization: Secondary | ICD-10-CM

## 2014-03-08 DIAGNOSIS — L821 Other seborrheic keratosis: Secondary | ICD-10-CM

## 2014-03-08 MED ORDER — AMBULATORY NON FORMULARY MEDICATION: Status: DC

## 2014-03-08 NOTE — Patient Instructions (Signed)
Apply vaseline daily and keep covered for about 2 days then still apply vaseline but don't have to keep covered Call if any sign of infection

## 2014-03-08 NOTE — Progress Notes (Signed)
   Subjective:    Patient ID: Nicole Duffy, female    DOB: [birth year], 80 y.o.   MRN: [id number]  HPI She is here for biopsy for a lesion on the top of her left foot. It looked at her last office visit. His been there for quite some time and actually had a black spot on it. The black spot has actually improved, but the main lesion is still present. It occasionally itchy and irritating.   Review of Systems     Objective:   Physical Exam  Approx 1.5 cm lesion on dorsum of the left foot.  Looks most consistant with seb keratosis.       Assessment & Plan:  Atypical lesion-shave biopsy performed to rule out abnormalities of the lesion such as squamous cell cancer.  She would like a prescription for the shingles vaccine to be able to get at the pharmacy.  Shave Biopsy Procedure Note  Pre-operative Diagnosis: Suspicious lesion  Post-operative Diagnosis: same  Locations: dorsum of the left foot  Indications: irritation and itching  Anesthesia: Lidocaine 1% with epinephrine without added sodium bicarbonate  Procedure Details   Patient informed of the risks (including bleeding and infection) and benefits of the  procedure and Verbal informed consent obtained.  The lesion and surrounding area were given a sterile prep using chlorhexidine and draped in the usual sterile fashion. A scalpel was used to shave an area of skin approximately 1.5cm by 1.5cm.  Hemostasis achieved with alumuninum chloride. Antibiotic ointment and a sterile dressing applied.  The specimen was sent for pathologic examination. The patient tolerated the procedure well.  EBL: 0 ml  Findings: awaith pathology  Condition: Stable  Complications: none.  Plan: 1. Instructed to keep the wound dry and covered for 24-48h and clean thereafter. 2. Warning signs of infection were reviewed.   3. Recommended that the patient use OTC acetaminophen as needed for pain.  4. Return PRN

## 2014-03-26 ENCOUNTER — Other Ambulatory Visit: Payer: Self-pay | Admitting: Family Medicine

## 2014-04-02 ENCOUNTER — Other Ambulatory Visit: Payer: Self-pay | Admitting: Family Medicine

## 2014-04-05 ENCOUNTER — Other Ambulatory Visit: Payer: Self-pay | Admitting: Family Medicine

## 2014-04-10 ENCOUNTER — Telehealth: Payer: Self-pay | Admitting: *Deleted

## 2014-04-10 NOTE — Telephone Encounter (Signed)
Nicole Duffy was notified of recommendations and verbalized understanding. Margette Fast, CMA

## 2014-04-10 NOTE — Telephone Encounter (Signed)
She can get the shingles vaccine at any time. She will need to take the prescription to the pharmacy. We printed her one in October. It's okay to get now. If she does not have any open sores then she is less likely to transmit the virus. Just encourage her to keep the affected areas covered when she is around others. Would recommend avoiding holding small children under the age of one If she feels like the rash might break out.

## 2014-04-10 NOTE — Telephone Encounter (Signed)
Nicole Duffy calls asking if she would be able to be around small children. She said that for the last week she has felt as if she has some shingles coming out again on her arm/leg. She said that it is not open or water filled yet but she can feel them and they are hurting. When can she get the shingles vaccine? Please advise. Margette Fast, CMA

## 2014-04-16 ENCOUNTER — Other Ambulatory Visit: Payer: Self-pay | Admitting: Family Medicine

## 2014-04-25 ENCOUNTER — Encounter: Payer: Self-pay | Admitting: Family Medicine

## 2014-04-25 ENCOUNTER — Ambulatory Visit (INDEPENDENT_AMBULATORY_CARE_PROVIDER_SITE_OTHER): Payer: Medicare Other | Admitting: Family Medicine

## 2014-04-25 VITALS — BP 136/72 | HR 103 | Temp 97.8°F | Wt 195.0 lb

## 2014-04-25 DIAGNOSIS — K921 Melena: Secondary | ICD-10-CM

## 2014-04-25 DIAGNOSIS — R110 Nausea: Secondary | ICD-10-CM

## 2014-04-25 DIAGNOSIS — R197 Diarrhea, unspecified: Secondary | ICD-10-CM

## 2014-04-25 MED ORDER — PANTOPRAZOLE SODIUM 40 MG PO TBEC: 40.0000 mg | DELAYED_RELEASE_TABLET | Freq: Every day | ORAL | Status: DC

## 2014-04-25 NOTE — Progress Notes (Signed)
   Subjective:    Patient ID: Nicole Duffy, female    DOB: April 13, 1932, 80 y.o.   MRN: [id number]  HPI Nausea and diarrhea - Feels nauseated  For several months.  Has been having stomach pain and diarrhea. Says she stays hungry but doesn't know what she wants.  Was very anxious on Monday and tearful.  The diarrhea is intermittant.  Has been having dark blood in the stools x 6 months. She has a hx of GI ulcers. Getting very lightheaded.  Applies a cold wash clothe and that helps.   Noticing redness and flushing on the arms when she gets hot. Says it lasts about an hour and says it hurts and keeps her up when it happens.  happens most nights.    Says usually eats cornflakes, applesauce and pancakes.  Doesn't eat regularly.    Getting shingles vaccine today at [facility].    Here son is here today.  Says she hasn't told anybody she has blood in the stool.    Review of Systems     Objective:   Physical Exam  Constitutional: She appears well-developed and well-nourished.  HENT:  Head: Normocephalic and atraumatic.  Abdominal: Soft. Bowel sounds are normal. She exhibits no distension and no mass. There is tenderness. There is no rebound and no guarding.  Hyperactive BS.  Generalized tenderness  Skin: Skin is warm and dry.  Psychiatric: She has a normal mood and affect. Her behavior is normal.          Assessment & Plan:  Melena-patient absolutely refuses to do a colonoscopy even though she seeing dark blood in her stool for the last 6 months. Most likely related to gastric ulcer. She has a prior history and she's been having abdominal pain and nausea and decreased appetite. We'll start her on a PPI, pantoprazole 40 mg daily. Take about 30 minutes before first meal the day. Try to eat more regularly. Increase protein in diet. We'll check a CBC to evaluate for anemia. We'll check a B12 as well. Still encouraged her to consider getting a colonoscopy. If she's not getting some symptom  relief in the next couple weeks I encouraged her call the office back and we can consider adjusting her PPI or adding Carafate. Next  Nausea/decreased appetite-most likely related to above. Will start PPI and see if this improves over the next couple of weeks. Consider H. pylori testing. Next  Diarrhea - could be colitis vs IBS,, vs poor diet vs melena.  Well check CBC.

## 2014-04-26 ENCOUNTER — Telehealth: Payer: Self-pay | Admitting: *Deleted

## 2014-04-26 LAB — COMPLETE METABOLIC PANEL WITH GFR
ALBUMIN: 3.9 g/dL (ref 3.5–5.2)
ALK PHOS: 136 U/L — AB (ref 39–117)
ALT: 10 U/L (ref 0–35)
AST: 16 U/L (ref 0–37)
BUN: 7 mg/dL (ref 6–23)
CALCIUM: 9.4 mg/dL (ref 8.4–10.5)
CO2: 28 mEq/L (ref 19–32)
CREATININE: 0.94 mg/dL (ref 0.50–1.10)
Chloride: 99 mEq/L (ref 96–112)
GFR, EST NON AFRICAN AMERICAN: 57 mL/min — AB
GFR, Est African American: 66 mL/min
Glucose, Bld: 94 mg/dL (ref 70–99)
Potassium: 3.9 mEq/L (ref 3.5–5.3)
Sodium: 137 mEq/L (ref 135–145)
Total Bilirubin: 0.7 mg/dL (ref 0.2–1.2)
Total Protein: 6.4 g/dL (ref 6.0–8.3)

## 2014-04-26 LAB — FERRITIN: Ferritin: 57 ng/mL (ref 10–291)

## 2014-04-26 LAB — VITAMIN B12: Vitamin B-12: 854 pg/mL (ref 211–911)

## 2014-04-26 LAB — TSH: TSH: 3.806 u[IU]/mL (ref 0.350–4.500)

## 2014-04-26 LAB — FOLATE: Folate: >20 ng/mL

## 2014-04-26 NOTE — Telephone Encounter (Signed)
Velinda called this morning just to clarify that she was to take pantoprazole 40mg  now not 20mg . I reviewed her med list and told her that it was changed on 04/25/14 and she verbalized understanding. Margette Fast, CMA

## 2014-04-29 ENCOUNTER — Other Ambulatory Visit: Payer: Self-pay | Admitting: *Deleted

## 2014-04-29 DIAGNOSIS — R748 Abnormal levels of other serum enzymes: Secondary | ICD-10-CM

## 2014-05-13 ENCOUNTER — Other Ambulatory Visit: Payer: Self-pay | Admitting: Family Medicine

## 2014-05-23 ENCOUNTER — Other Ambulatory Visit: Payer: Self-pay | Admitting: Family Medicine

## 2014-05-23 ENCOUNTER — Ambulatory Visit: Payer: Medicare Other | Admitting: Family Medicine

## 2014-05-24 LAB — ALKALINE PHOSPHATASE: Alkaline Phosphatase: 123 U/L — ABNORMAL HIGH (ref 39–117)

## 2014-05-24 LAB — GAMMA GT: GGT: 16 U/L (ref 7–51)

## 2014-06-04 ENCOUNTER — Other Ambulatory Visit: Payer: Self-pay | Admitting: Family Medicine

## 2014-06-07 ENCOUNTER — Ambulatory Visit: Payer: Self-pay | Admitting: Family Medicine

## 2014-06-11 ENCOUNTER — Other Ambulatory Visit: Payer: Self-pay | Admitting: Family Medicine

## 2014-06-13 ENCOUNTER — Telehealth: Payer: Self-pay

## 2014-06-13 NOTE — Telephone Encounter (Signed)
She states it has helped some. She is taking it daily

## 2014-06-13 NOTE — Telephone Encounter (Signed)
Nicole Duffy is needing a follow up for her abdominal pain and nausea. I did schedule her for tomorrow in an acute slot. Do you have any recommendations in the meantime?

## 2014-06-13 NOTE — Telephone Encounter (Signed)
That she felt like the pantoprazole has helped or not?

## 2014-06-14 ENCOUNTER — Other Ambulatory Visit: Payer: Self-pay | Admitting: Family Medicine

## 2014-06-14 ENCOUNTER — Encounter: Payer: Self-pay | Admitting: Family Medicine

## 2014-06-14 ENCOUNTER — Ambulatory Visit (INDEPENDENT_AMBULATORY_CARE_PROVIDER_SITE_OTHER): Payer: Medicare Other | Admitting: Family Medicine

## 2014-06-14 VITALS — BP 136/78 | HR 96 | Ht 66.0 in | Wt 197.0 lb

## 2014-06-14 DIAGNOSIS — K2100 Gastro-esophageal reflux disease with esophagitis, without bleeding: Secondary | ICD-10-CM

## 2014-06-14 DIAGNOSIS — K921 Melena: Secondary | ICD-10-CM

## 2014-06-14 MED ORDER — SUCRALFATE 1 GM/10ML PO SUSP: 1.0000 g | Freq: Three times a day (TID) | ORAL | Status: DC

## 2014-06-14 NOTE — Telephone Encounter (Signed)
She really needs to see GI. I did mention together this before. It may be more effective for her to see them the knee. Happy to put in an urgent referral and see if they can get her in next week. She still welcome to keep her appointment today if she would like.

## 2014-06-14 NOTE — Progress Notes (Signed)
   Subjective:    Patient ID: Nicole Duffy, female    DOB: 29-Jul-1931, 81 y.o.   MRN: [id number]  HPI Reflex is severe. Says has blood on the stool is bright red. .  She is now willing to see GI now.  History of GI ulcers.  She is taking the higher dose of pantoprozole 40mg .  She is belching a lot and she says her reflux is keeping her up at night.    Review of Systems     Objective:   Physical Exam  Constitutional: She is oriented to person, place, and time. She appears well-developed and well-nourished.  HENT:  Head: Normocephalic and atraumatic.  Cardiovascular: Normal rate, regular rhythm and normal heart sounds.   Pulmonary/Chest: Effort normal and breath sounds normal.  Neurological: She is alert and oriented to person, place, and time.  Skin: Skin is warm and dry.  Psychiatric: She has a normal mood and affect. Her behavior is normal.          Assessment & Plan:  GERD- Not well controlled.  Will add carafate.  Melena - will send ot GI for further eval. She is willing to have endoscopy but not colonoscopy.

## 2014-06-14 NOTE — Telephone Encounter (Signed)
Patient was seen in office today.  

## 2014-06-17 ENCOUNTER — Other Ambulatory Visit: Payer: Self-pay | Admitting: Family Medicine

## 2014-07-09 ENCOUNTER — Other Ambulatory Visit: Payer: Self-pay | Admitting: Family Medicine

## 2014-07-18 ENCOUNTER — Other Ambulatory Visit: Payer: Self-pay | Admitting: Family Medicine

## 2014-07-22 ENCOUNTER — Other Ambulatory Visit: Payer: Self-pay | Admitting: Family Medicine

## 2014-07-24 ENCOUNTER — Encounter: Payer: Self-pay | Admitting: Family Medicine

## 2014-08-02 ENCOUNTER — Other Ambulatory Visit: Payer: Self-pay | Admitting: Family Medicine

## 2014-08-13 ENCOUNTER — Ambulatory Visit (INDEPENDENT_AMBULATORY_CARE_PROVIDER_SITE_OTHER): Payer: Medicare Other | Admitting: Family Medicine

## 2014-08-13 ENCOUNTER — Other Ambulatory Visit: Payer: Self-pay | Admitting: Family Medicine

## 2014-08-13 ENCOUNTER — Encounter: Payer: Self-pay | Admitting: Family Medicine

## 2014-08-13 VITALS — BP 135/66 | HR 94 | Wt 197.0 lb

## 2014-08-13 DIAGNOSIS — R51 Headache: Secondary | ICD-10-CM | POA: Diagnosis not present

## 2014-08-13 DIAGNOSIS — H6123 Impacted cerumen, bilateral: Secondary | ICD-10-CM | POA: Diagnosis not present

## 2014-08-13 DIAGNOSIS — F411 Generalized anxiety disorder: Secondary | ICD-10-CM | POA: Diagnosis not present

## 2014-08-13 DIAGNOSIS — R519 Headache, unspecified: Secondary | ICD-10-CM

## 2014-08-13 DIAGNOSIS — G4700 Insomnia, unspecified: Secondary | ICD-10-CM | POA: Diagnosis not present

## 2014-08-13 MED ORDER — NORTRIPTYLINE HCL 25 MG PO CAPS: ORAL_CAPSULE | ORAL | Status: DC

## 2014-08-13 MED ORDER — ALPRAZOLAM 0.25 MG PO TABS: ORAL_TABLET | ORAL | Status: DC

## 2014-08-13 NOTE — Progress Notes (Signed)
   Subjective:    Patient ID: Nicole Duffy, female    DOB: 1931/11/18, 81 y.o.   MRN: [id number]  HPI   Patient comes in today. She says her daughter-in-law is no longer going to be taking care of her medications. She says she's going to be in charge of her all medications. This is sort of been an ongoing battle for the last year. She is here today with a friend named Barnett Applebaum. I have never met her before.  Six-month follow-up for anxiety-she's currently taking Viibryd 40 mg daily and alprazolam up to twice a day. 0.25 mg.  She did see GI for the melena. They did a scope and CT scan. She says she didn't want to do any aggressive.    She would like a handicap placard.   Her head and knees have been painful. She has been taking nausea medication. Once you know she can take the tramadol more often. She is getting 60 tabs per month.  She has been having frequent headaches. She does see a chiropractor couple weeks ago and says it was helpful for reducing her headaches.  She also wants [place] to look at her ears today. She feels like they're blocked up. She also feels like sometimes she gets a water motion sound in her ears. No fever or pain per se. Review of Systems     Objective:   Physical Exam  Constitutional: She is oriented to person, place, and time. She appears well-developed and well-nourished.  HENT:  Head: Normocephalic and atraumatic.  Right Ear: External ear normal.  Left Ear: External ear normal.  Nose: Nose normal.  Mouth/Throat: Oropharynx is clear and moist.  Both TMs blocked by cerumen.   Eyes: Conjunctivae and EOM are normal. Pupils are equal, round, and reactive to light.  Neck: Neck supple. No thyromegaly present.  Cardiovascular: Normal rate, regular rhythm and normal heart sounds.   Pulmonary/Chest: Effort normal and breath sounds normal. She has no wheezes.  Lymphadenopathy:    She has no cervical adenopathy.  Neurological: She is alert and oriented to person, place,  and time.  Skin: Skin is warm and dry.  Psychiatric: She has a normal mood and affect.          Assessment & Plan:  Generalized anxiety-she's really has done fantastic on the [place] but does feel like her anxiety is been up lately. We will refill her alprazolam for 60 tabs which is the usual prescription. She typically takes 1 every morning. The second dose really is more as needed. Just reminded her about increased risk of falls, habit-forming, and increased risk of dementia. She needs to make sure that she is the second tabs sparingly. And really 60 tabs should actually last her 6 weeks.  She can drop off a handicap placard at her convenience and will be happy to fill it out for her.  Insomnia-encouraged her restart the nortriptyline. She has not been taking it. She things her daughter-in-law might not have refilled it and in fact they had actually put her on melatonin instead. She was doing fantastic on this medication back in October. Was hoping her sleep and helping with her posttraumatic neuralgia. It would also help with her frequent headaches.  Cerumen impaction-offer to irrigate today. Performed today. Patient tolerated well  HA - restart the notritypline.  Continue seeing chiropractor since that has been helping  Knee pain - Ok to increase to tramadol to TID prn.

## 2014-08-13 NOTE — Telephone Encounter (Signed)
Needs appt to f/u BP. Last appt specifically for BP was 4/15

## 2014-08-19 ENCOUNTER — Other Ambulatory Visit: Payer: Self-pay | Admitting: Family Medicine

## 2014-09-09 ENCOUNTER — Other Ambulatory Visit: Payer: Self-pay | Admitting: Physician Assistant

## 2014-09-12 ENCOUNTER — Telehealth: Payer: Self-pay | Admitting: *Deleted

## 2014-09-12 NOTE — Telephone Encounter (Signed)
Pt called requesting a refill for tramadol.[name] Eunice

## 2014-09-17 ENCOUNTER — Other Ambulatory Visit: Payer: Self-pay | Admitting: Physician Assistant

## 2014-09-18 ENCOUNTER — Telehealth: Payer: Self-pay

## 2014-09-18 ENCOUNTER — Other Ambulatory Visit: Payer: Self-pay | Admitting: Family Medicine

## 2014-09-18 ENCOUNTER — Other Ambulatory Visit: Payer: Self-pay

## 2014-09-18 DIAGNOSIS — R197 Diarrhea, unspecified: Secondary | ICD-10-CM

## 2014-09-18 DIAGNOSIS — K921 Melena: Secondary | ICD-10-CM

## 2014-09-18 DIAGNOSIS — R110 Nausea: Secondary | ICD-10-CM

## 2014-09-18 MED ORDER — TRAMADOL HCL 50 MG PO TABS: 50.0000 mg | ORAL_TABLET | Freq: Three times a day (TID) | ORAL | Status: DC | PRN

## 2014-09-18 NOTE — Telephone Encounter (Signed)
Nicole Duffy reports increase in her stomach pain over the last month. She is taking her Tramadol TID and has ran out. She would like a new prescription for # 90. Please advise.

## 2014-09-19 MED ORDER — PANTOPRAZOLE SODIUM 40 MG PO TBEC: 40.0000 mg | DELAYED_RELEASE_TABLET | Freq: Two times a day (BID) | ORAL | Status: DC

## 2014-09-19 MED ORDER — TRAMADOL HCL 50 MG PO TABS: 50.0000 mg | ORAL_TABLET | Freq: Three times a day (TID) | ORAL | Status: DC | PRN

## 2014-09-19 MED ORDER — RANITIDINE HCL 300 MG PO TABS: 300.0000 mg | ORAL_TABLET | Freq: Two times a day (BID) | ORAL | Status: DC

## 2014-09-19 MED ORDER — SUCRALFATE 1 G PO TABS: 1.0000 g | ORAL_TABLET | Freq: Four times a day (QID) | ORAL | Status: DC

## 2014-09-19 NOTE — Telephone Encounter (Signed)
Refilling tramadol, considering worsening of her peptic ulcer disease, we do need to increase her Protonix to 40 mg twice a day, add ranitidine twice a day, as well as add Carafate. Rx's in box.

## 2014-09-20 NOTE — Telephone Encounter (Signed)
Patient advised. Medications faxed.

## 2014-10-08 ENCOUNTER — Other Ambulatory Visit: Payer: Self-pay | Admitting: Family Medicine

## 2014-10-11 ENCOUNTER — Other Ambulatory Visit: Payer: Self-pay | Admitting: Family Medicine

## 2014-10-14 ENCOUNTER — Other Ambulatory Visit: Payer: Self-pay | Admitting: Family Medicine

## 2014-10-22 ENCOUNTER — Telehealth: Payer: Self-pay | Admitting: *Deleted

## 2014-10-22 NOTE — Telephone Encounter (Signed)
Patient given appt for 8 AM tomorrow morning.

## 2014-10-22 NOTE — Telephone Encounter (Signed)
Pt called today that she has shingles again for the 4th time.  This time the rash is on her face.  She stated that it's itching terribly and hurting really bad. The tramadol she has isn't helping her at all and she wanted to know what she should do.  Please advise.

## 2014-10-23 ENCOUNTER — Ambulatory Visit: Payer: Medicare Other | Admitting: Family Medicine

## 2014-10-25 ENCOUNTER — Encounter: Payer: Self-pay | Admitting: Family Medicine

## 2014-10-25 ENCOUNTER — Ambulatory Visit (INDEPENDENT_AMBULATORY_CARE_PROVIDER_SITE_OTHER): Payer: Medicare Other | Admitting: Family Medicine

## 2014-10-25 VITALS — BP 148/75 | HR 88 | Wt 199.0 lb

## 2014-10-25 DIAGNOSIS — F419 Anxiety disorder, unspecified: Secondary | ICD-10-CM | POA: Diagnosis not present

## 2014-10-25 DIAGNOSIS — G44229 Chronic tension-type headache, not intractable: Secondary | ICD-10-CM | POA: Diagnosis not present

## 2014-10-25 DIAGNOSIS — L259 Unspecified contact dermatitis, unspecified cause: Secondary | ICD-10-CM

## 2014-10-25 DIAGNOSIS — T887XXA Unspecified adverse effect of drug or medicament, initial encounter: Secondary | ICD-10-CM

## 2014-10-25 DIAGNOSIS — T50905A Adverse effect of unspecified drugs, medicaments and biological substances, initial encounter: Secondary | ICD-10-CM

## 2014-10-25 MED ORDER — HYDROCODONE-ACETAMINOPHEN 5-325 MG PO TABS: 1.0000 | ORAL_TABLET | Freq: Every day | ORAL | Status: DC | PRN

## 2014-10-25 MED ORDER — VILAZODONE HCL 40 MG PO TABS: 40.0000 mg | ORAL_TABLET | Freq: Every day | ORAL | Status: DC

## 2014-10-25 NOTE — Progress Notes (Signed)
   Subjective:    Patient ID: Nicole Duffy, female    DOB: [birth year], 81 y.o.   MRN: [id number]  HPI Has a painful itchy rash on her face that she thinks is shingles. It's been itchy and tender. She had more swollen bumps on the side of her face a couple days ago. No fevers chills or sweats. He has had the shingles vaccine. And she's had shingles before. She did change soaps recently. She has been applying over-the-counter hydrocortisone cream it does seem to help with the itching.  Has had a severe HA as well. She would really like something stronger for pain.  She still continues to have chronic headaches. The tramadol helps but sometimes it's not enough. She has taken hydrocodone before when she had her knee replacement.  Her granddaughter is also here with her today.  She wants to know she can increase her Xanax and have extra tabs to take occasionally. We have been trying to wean her down over the last year and a half from 1 mg down to 0.25 mg. She is taking the Nicole Duffy. She did try nortriptyline at night to help with nerve pain and to help with sleep. She says that it made her feel like she was going crazy. She stopped it. Will add to her intolerance list.   Review of Systems     Objective:   Physical Exam  Constitutional: She is oriented to person, place, and time. She appears well-developed and well-nourished.  HENT:  Head: Normocephalic and atraumatic.  Eyes: Conjunctivae and EOM are normal.  Cardiovascular: Normal rate.   Pulmonary/Chest: Effort normal.  Neurological: She is alert and oriented to person, place, and time.  Skin: Skin is warm and dry. Rash noted. No pallor.  Splotchy erythema on the cheeks bilaterally. She has several seborrheic keratoses that appear to be irritated and inflamed on both facial cheeks and temple area.  Psychiatric: She has a normal mood and affect. Her behavior is normal.          Assessment & Plan:  Contact dermatitis - change the new  soap back to her old soap.  Continue the over-the-counter hydrocortisone.  Per the granddaughter he was here the rash does look better today. It seems to be helping with her itching as well. I suspect there something that she has come in contact with that has irritated the skin. This does not look consistent with shingles.  Chronic headaches-we'll give her small quantity of hydrocodone to use sparingly. One about the potential for sedation and addiction. She continues the tramadol as her mainstay medication and can use the hydrocodone sparingly.  Anxiety-she did ask if I would increase her Xanax. She already get 60 tabs per month. This is plenty. She is on the [place] as well. I discussed with her that we are not going up on the quantity or the dose. We have worked very hard over the last year and a half to reduce the strength and quantity of the benzodiazepine she is currently using. I'm not going to go back up at this point in time.  Medication reaction-nortriptyline added to intolerance list.

## 2014-11-04 ENCOUNTER — Other Ambulatory Visit: Payer: Self-pay | Admitting: Family Medicine

## 2014-11-08 ENCOUNTER — Other Ambulatory Visit: Payer: Self-pay | Admitting: Family Medicine

## 2014-11-15 ENCOUNTER — Other Ambulatory Visit: Payer: Self-pay

## 2014-11-15 MED ORDER — ALPRAZOLAM 0.25 MG PO TABS: ORAL_TABLET | ORAL | Status: DC

## 2014-11-26 ENCOUNTER — Other Ambulatory Visit: Payer: Self-pay | Admitting: Family Medicine

## 2014-11-29 ENCOUNTER — Other Ambulatory Visit: Payer: Self-pay | Admitting: Family Medicine

## 2014-11-29 ENCOUNTER — Encounter: Payer: Self-pay | Admitting: Family Medicine

## 2014-11-29 ENCOUNTER — Ambulatory Visit (INDEPENDENT_AMBULATORY_CARE_PROVIDER_SITE_OTHER): Payer: Medicare Other | Admitting: Family Medicine

## 2014-11-29 VITALS — BP 159/53 | HR 113 | Ht 66.0 in | Wt 177.0 lb

## 2014-11-29 DIAGNOSIS — G44229 Chronic tension-type headache, not intractable: Secondary | ICD-10-CM

## 2014-11-29 DIAGNOSIS — R443 Hallucinations, unspecified: Secondary | ICD-10-CM | POA: Diagnosis not present

## 2014-11-29 DIAGNOSIS — F411 Generalized anxiety disorder: Secondary | ICD-10-CM

## 2014-11-29 DIAGNOSIS — R5383 Other fatigue: Secondary | ICD-10-CM | POA: Diagnosis not present

## 2014-11-29 DIAGNOSIS — R634 Abnormal weight loss: Secondary | ICD-10-CM

## 2014-11-29 DIAGNOSIS — R1032 Left lower quadrant pain: Secondary | ICD-10-CM | POA: Diagnosis not present

## 2014-11-29 LAB — CBC WITH DIFFERENTIAL/PLATELET
Basophils Absolute: 0 10*3/uL (ref 0.0–0.1)
Basophils Relative: 0 % (ref 0–1)
Eosinophils Absolute: 0 10*3/uL (ref 0.0–0.7)
Eosinophils Relative: 0 % (ref 0–5)
HEMATOCRIT: 42.9 % (ref 36.0–46.0)
HEMOGLOBIN: 14.1 g/dL (ref 12.0–15.0)
Lymphocytes Relative: 22 % (ref 12–46)
Lymphs Abs: 2.1 10*3/uL (ref 0.7–4.0)
MCH: 27.3 pg (ref 26.0–34.0)
MCHC: 32.9 g/dL (ref 30.0–36.0)
MCV: 83.1 fL (ref 78.0–100.0)
MPV: 11.1 fL (ref 8.6–12.4)
Monocytes Absolute: 0.6 10*3/uL (ref 0.1–1.0)
Monocytes Relative: 6 % (ref 3–12)
NEUTROS ABS: 7 10*3/uL (ref 1.7–7.7)
NEUTROS PCT: 72 % (ref 43–77)
PLATELETS: 297 10*3/uL (ref 150–400)
RBC: 5.16 MIL/uL — ABNORMAL HIGH (ref 3.87–5.11)
RDW: 14.5 % (ref 11.5–15.5)
WBC: 9.7 10*3/uL (ref 4.0–10.5)

## 2014-11-29 NOTE — Progress Notes (Signed)
   Subjective:    Patient ID: Nicole Duffy, female    DOB: 09/12/1931, 81 y.o.   MRN: [id number]  HPI  Comes in today and reports about 3 weeks ago turned the lights off and could still see things moving and thought it was the devil.  Happened about 3 more nights.  Has had episdoes of fatigue and nausea.      Follow-up for chronic headaches -she  Was still complaining of almost daily headaches. She fell at the tramadol was not really helpful. She had specifically request a small quantity of hydrocodone to use sparingly.  At her visit in June. We discussed the potential for sedation and addiction and to use very sparingly. She ddid call for a refill at the beginning of the month for 30 tabs. Still having LLQ pain.  CT done in March at [facility] showed densities on the pancreas and kidney.    Anxiety- currently on Viibryd which I believe is working well. We worked very hard to wean down her dose on her benzodiazepine. She is down to 0.25 mg twice a day.     her son Nicole Duffy is here with her today for her office visit.  Review of Systems     Objective:   Physical Exam  Constitutional: She is oriented to person, place, and time. She appears well-developed and well-nourished.  HENT:  Head: Normocephalic and atraumatic.  Cardiovascular: Normal rate, regular rhythm and normal heart sounds.   Pulmonary/Chest: Effort normal and breath sounds normal.  Abdominal: Soft. Bowel sounds are normal. She exhibits no distension and no mass. There is tenderness. There is no rebound and no guarding.  + TTP in the LLQ and RLQ pain  Neurological: She is alert and oriented to person, place, and time.  Skin: Skin is warm and dry.  Psychiatric: She has a normal mood and affect. Her behavior is normal.          Assessment & Plan:  Mental status changes.  Likely from the hydrocodone.  Stop  The medication immediately. Her son also stop the Zofran as he read online that can also cause some mental status changes. I  think it's  Good to stop both the next 3-4 weeks and see if her symptoms completely resolved. They do and she can try restarting the Zofran but I would stay away from the hydrocodone completely. makee sure staying hydrated.  Abnormal weight loss-she's actually lost 22 pounds in 5 weeks. The thymus a half a pound a day. This is quite concernin. I strongly encouraged her to follow back up with GI for that they can continue to workup the lesions on her pancreas and her kidney and continue to follow-up on her abdominal pain that she's been having. She's also supposed to go back next month to have some more polyps removed from her colon. I really think abdominal pain decreased appetite and weight loss and nausea and weakness are all related. Her son was here for the conversation and encouraged him to schedule an appointment with them as soon as possible.  Chronic headaches-  Will go back to just using tramadol as needed and completely hold off on the hydrocodone. She's been on the tramadol for quite some time it's never really caused a problem.  Anxiety- continue Viibryd and xanax.   We can hopefully continue to wean the Xanax over the next 6-12 months.

## 2014-11-30 LAB — COMPLETE METABOLIC PANEL WITH GFR
ALBUMIN: 3.9 g/dL (ref 3.5–5.2)
ALK PHOS: 111 U/L (ref 39–117)
ALT: 12 U/L (ref 0–35)
AST: 18 U/L (ref 0–37)
BILIRUBIN TOTAL: 0.6 mg/dL (ref 0.2–1.2)
BUN: 9 mg/dL (ref 6–23)
CALCIUM: 9.3 mg/dL (ref 8.4–10.5)
CO2: 22 mEq/L (ref 19–32)
Chloride: 103 mEq/L (ref 96–112)
Creat: 1.02 mg/dL (ref 0.50–1.10)
GFR, Est African American: 60 mL/min
GFR, Est Non African American: 52 mL/min — ABNORMAL LOW
Glucose, Bld: 125 mg/dL — ABNORMAL HIGH (ref 70–99)
Potassium: 3.9 mEq/L (ref 3.5–5.3)
SODIUM: 141 meq/L (ref 135–145)
Total Protein: 6.6 g/dL (ref 6.0–8.3)

## 2014-11-30 LAB — SEDIMENTATION RATE: Sed Rate: 8 mm/hr (ref 0–30)

## 2014-11-30 LAB — C-REACTIVE PROTEIN

## 2014-12-02 LAB — TSH: TSH: 2.064 u[IU]/mL (ref 0.350–4.500)

## 2014-12-03 ENCOUNTER — Other Ambulatory Visit: Payer: Self-pay | Admitting: Sports Medicine

## 2014-12-14 ENCOUNTER — Other Ambulatory Visit: Payer: Self-pay | Admitting: Family Medicine

## 2014-12-17 NOTE — Telephone Encounter (Signed)
Metoprolol hasn't been written since last year.  Does she still take this?

## 2014-12-18 ENCOUNTER — Other Ambulatory Visit: Payer: Self-pay | Admitting: Sports Medicine

## 2014-12-20 ENCOUNTER — Telehealth: Payer: Self-pay | Admitting: *Deleted

## 2014-12-20 NOTE — Telephone Encounter (Signed)
Pt's son called this morning wanting to know if that if for some reason the pt missed a couple doses of her xanax if that would cause hallucinations.  Last night she was up walking around and knocking on her neighbors bedroom door at the nursing facility) & she is seeing things that aren't there, for instance, black snakes in her room.  Please advise.

## 2014-12-20 NOTE — Telephone Encounter (Signed)
Yes, it it possible but there are 70 things that can cause delirium that they really should take her to the emergency department and have her evaluated. It could be an infection etc.

## 2014-12-20 NOTE — Telephone Encounter (Signed)
Pt's son notified

## 2014-12-23 ENCOUNTER — Other Ambulatory Visit: Payer: Self-pay | Admitting: Family Medicine

## 2015-01-21 ENCOUNTER — Telehealth: Payer: Self-pay | Admitting: *Deleted

## 2015-01-21 NOTE — Telephone Encounter (Signed)
vo for continued PT, OT and HHA  pt has been d/c'd from [place] and back to independent care.

## 2015-01-23 ENCOUNTER — Ambulatory Visit (INDEPENDENT_AMBULATORY_CARE_PROVIDER_SITE_OTHER): Payer: Medicare Other | Admitting: Family Medicine

## 2015-01-23 ENCOUNTER — Encounter: Payer: Self-pay | Admitting: Family Medicine

## 2015-01-23 VITALS — BP 147/72 | HR 97 | Ht 65.0 in | Wt 187.0 lb

## 2015-01-23 DIAGNOSIS — R1084 Generalized abdominal pain: Secondary | ICD-10-CM | POA: Diagnosis not present

## 2015-01-23 DIAGNOSIS — F32A Depression, unspecified: Secondary | ICD-10-CM

## 2015-01-23 DIAGNOSIS — K862 Cyst of pancreas: Secondary | ICD-10-CM | POA: Diagnosis not present

## 2015-01-23 DIAGNOSIS — E876 Hypokalemia: Secondary | ICD-10-CM | POA: Diagnosis not present

## 2015-01-23 DIAGNOSIS — F329 Major depressive disorder, single episode, unspecified: Secondary | ICD-10-CM

## 2015-01-23 DIAGNOSIS — F411 Generalized anxiety disorder: Secondary | ICD-10-CM

## 2015-01-23 DIAGNOSIS — Z889 Allergy status to unspecified drugs, medicaments and biological substances status: Secondary | ICD-10-CM

## 2015-01-23 DIAGNOSIS — Q6100 Congenital renal cyst, unspecified: Secondary | ICD-10-CM

## 2015-01-23 DIAGNOSIS — N281 Cyst of kidney, acquired: Secondary | ICD-10-CM | POA: Insufficient documentation

## 2015-01-23 NOTE — Progress Notes (Signed)
   Subjective:    Patient ID: Nicole Duffy, female    DOB: 09/13/1931, 81 y.o.   MRN: [id number]  HPI F/U hospital admission. She was admitted 5 days for acute delirium. She was complaining of seeing snakes and random strangers in her home. They think that she may have taken some extra Xanax. She says she doesn't really remember. She certainly was not intentionally trying to harm herself. But it put her into an acute hyperactive delirium. They stopped her Xanax completely while she was in the hospital. She then went to a rehabilitation facility for about a week and now is getting some home health. She also had a CT pulmonary angiogram which was negative as well as the teeth CT of the head that was negative. She has CT of the abdomen and pelvis also negative but did note a cyst on the pancreas as well as the left kidney that they recommended be reevaluated in about 6 months.  Still having some stomach problems.  She is taking a probiotic.  She is taking a MVI as well.  Some diarrhea.  She often has a BM every 2 hours. Says she still feels foggy headed.  Her daughter in law say she has been eating there.  She had a fairly normal endoscopy and colonoscopy. Per discharge records they encouraged her to follow-up with GI.   She has been working with physical therapy on using her walker some instead of relying solely on a wheelchair. She's actually done well overall. She also had a negative workup for C. difficile in the hospital.  Anxiety/depression-overall doing okay since being back home but does complain of feeling jittery on the inside.   Review of Systems     Objective:   Physical Exam  Constitutional: She is oriented to person, place, and time. She appears well-developed and well-nourished.  HENT:  Head: Normocephalic and atraumatic.  Cardiovascular: Normal rate, regular rhythm and normal heart sounds.   Pulmonary/Chest: Effort normal and breath sounds normal.  Neurological: She is alert and  oriented to person, place, and time.  Skin: Skin is warm and dry.  Psychiatric: She has a normal mood and affect. Her behavior is normal.          Assessment & Plan:  Acute delirium-resolved after discontinuation of narcotics and alprazolam. Both of which we have been weaning over the last year or so.  Generalized anxiety disorder/Depression-currently on Viibryd and doing well. Follow-up in 3 months.   Abdominal pain/discomfort-she's had a full workup with GI. They did recommend that she follow back up with them and I encouraged her to do so. They did stop her proton expected leave her on ranitidine instead. She said she did well on Tagamet years ago. I prefer the ranitidine over the Tagamet because a few or medication interactions. Next  Pancreatic cyst-unknown etiology. We'll need repeat CT in about 6 months for evaluation.  Left renal cyst-again will need repeat imaging in 6 months. Ultrasound was recommended that we should be able to see this fairly well on the CT that we are going to do to follow the pancreatic cyst.

## 2015-01-24 LAB — BASIC METABOLIC PANEL WITH GFR
BUN: 10 mg/dL (ref 7–25)
CO2: 21 mmol/L (ref 20–31)
Calcium: 9.5 mg/dL (ref 8.6–10.4)
Chloride: 100 mmol/L (ref 98–110)
Creat: 1.08 mg/dL — ABNORMAL HIGH (ref 0.60–0.88)
GFR, EST NON AFRICAN AMERICAN: 48 mL/min — AB (ref >=60)
GFR, Est African American: 56 mL/min — ABNORMAL LOW (ref >=60)
Glucose, Bld: 132 mg/dL — ABNORMAL HIGH (ref 65–99)
POTASSIUM: 3.8 mmol/L (ref 3.5–5.3)
Sodium: 134 mmol/L — ABNORMAL LOW (ref 135–146)

## 2015-02-06 ENCOUNTER — Telehealth: Payer: Self-pay | Admitting: Family Medicine

## 2015-02-06 NOTE — Telephone Encounter (Signed)
Patient called clinic, stating she has been feeling "not herself" since Sunday. Reports extreme fatigue, chest pain which started yesterday, and woke up with shortness of breath this morning. Advised since Pt is having active chest pain and shortness of breath to go to the ED now. Pt states she will call her son to take her. Advised I could send an Ambulance to her house. States she would prefer her son take her, if he cannot Pt verbalized she would call 911. Advised Pt to contact our office if there is anything else we can do for her but to get to the ED ASAP. Verbalized understanding.

## 2015-02-17 ENCOUNTER — Other Ambulatory Visit: Payer: Self-pay | Admitting: Family Medicine

## 2015-03-08 ENCOUNTER — Other Ambulatory Visit: Payer: Self-pay | Admitting: Family Medicine

## 2015-03-10 ENCOUNTER — Other Ambulatory Visit: Payer: Self-pay | Admitting: *Deleted

## 2015-04-01 ENCOUNTER — Ambulatory Visit (INDEPENDENT_AMBULATORY_CARE_PROVIDER_SITE_OTHER): Payer: Medicare Other | Admitting: Family Medicine

## 2015-04-01 ENCOUNTER — Encounter: Payer: Self-pay | Admitting: Family Medicine

## 2015-04-01 VITALS — BP 122/66 | HR 112

## 2015-04-01 DIAGNOSIS — Q6100 Congenital renal cyst, unspecified: Secondary | ICD-10-CM | POA: Diagnosis not present

## 2015-04-01 DIAGNOSIS — K862 Cyst of pancreas: Secondary | ICD-10-CM

## 2015-04-01 DIAGNOSIS — T50905A Adverse effect of unspecified drugs, medicaments and biological substances, initial encounter: Secondary | ICD-10-CM

## 2015-04-01 DIAGNOSIS — N281 Cyst of kidney, acquired: Secondary | ICD-10-CM

## 2015-04-01 DIAGNOSIS — K219 Gastro-esophageal reflux disease without esophagitis: Secondary | ICD-10-CM | POA: Diagnosis not present

## 2015-04-01 DIAGNOSIS — F411 Generalized anxiety disorder: Secondary | ICD-10-CM | POA: Diagnosis not present

## 2015-04-01 DIAGNOSIS — T887XXA Unspecified adverse effect of drug or medicament, initial encounter: Secondary | ICD-10-CM

## 2015-04-01 NOTE — Progress Notes (Signed)
   Subjective:    Patient ID: Nicole Duffy, female    DOB: Jun 30, 1931, 81 y.o.   MRN: [id number]  HPI Stomach pain - she is doing well. She is on ranitidine 300mg  BID.  She is off of the PPI. Her daughter-in-law who is here with her today says she hasn't been complaining as much about her stomach.  Anxiety - doing well on viibryd. She is off the xanax.  In fact this is been added to her intolerance list because it was causing hallucinations. Her daughter-in-law reminded me to add the tramadol as well as it has also been causing hallucinations.  She went to the ED 2 weeks ago for shingles breakout on her left posterior shoulder. Says it is scabbing over now.  It was painful but not as much as last time.   Evidently she had a repeat CT of the abdomen and pelvis done. It showed no change in the cyst on her pancreas or her kidney. They recommend repeat CT of the abdomen and pelvis and one year with an ultrasound of the left kidney to follow-up on the 17 or lesions and make sure that it's not solid.  Review of Systems     Objective:   Physical Exam  Constitutional: She is oriented to person, place, and time. She appears well-developed and well-nourished.  HENT:  Head: Normocephalic and atraumatic.  Cardiovascular: Normal rate, regular rhythm and normal heart sounds.   Pulmonary/Chest: Effort normal and breath sounds normal.  Neurological: She is alert and oriented to person, place, and time.  Skin: Skin is warm and dry.  Psychiatric: She has a normal mood and affect. Her behavior is normal.          Assessment & Plan:  GERD-doing well on H2 blocker. Continue to monitor dietary intake. Next  Anxiety-well-controlled on Viibryd. Follow-up in 3 months.  Pancreatic cyst-due for repeat CT in one year. Next  Left renal cyst-we'll schedule for ultrasound for further evaluation.  Medication reaction-tramadol added to intolerance list as causing hallucinations.

## 2015-04-03 ENCOUNTER — Ambulatory Visit (INDEPENDENT_AMBULATORY_CARE_PROVIDER_SITE_OTHER): Payer: Medicare Other

## 2015-04-03 DIAGNOSIS — N281 Cyst of kidney, acquired: Secondary | ICD-10-CM

## 2015-04-03 DIAGNOSIS — Q6100 Congenital renal cyst, unspecified: Secondary | ICD-10-CM

## 2015-04-04 ENCOUNTER — Telehealth: Payer: Self-pay | Admitting: Emergency Medicine

## 2015-04-04 DIAGNOSIS — N289 Disorder of kidney and ureter, unspecified: Secondary | ICD-10-CM

## 2015-04-04 NOTE — Telephone Encounter (Signed)
Patient's daughter called after receiving message that MRI recommended per [facility] Radiologist. She would like to proceed with appt. for this.Apparently this will need to be done [facility] on a Saturday.Requests we call daughter at [phone] when [email or web address]

## 2015-04-07 NOTE — Telephone Encounter (Signed)
MRI ordered

## 2015-04-08 ENCOUNTER — Other Ambulatory Visit: Payer: Self-pay | Admitting: Sports Medicine

## 2015-04-09 ENCOUNTER — Telehealth: Payer: Self-pay | Admitting: Emergency Medicine

## 2015-04-09 DIAGNOSIS — N2889 Other specified disorders of kidney and ureter: Secondary | ICD-10-CM

## 2015-04-13 NOTE — Telephone Encounter (Signed)
BMP with GFR ordered.

## 2015-04-22 LAB — BASIC METABOLIC PANEL WITH GFR
BUN: 11 mg/dL (ref 7–25)
CALCIUM: 9 mg/dL (ref 8.6–10.4)
CO2: 28 mmol/L (ref 20–31)
Chloride: 99 mmol/L (ref 98–110)
Creat: 1 mg/dL — ABNORMAL HIGH (ref 0.60–0.88)
GFR, EST AFRICAN AMERICAN: 61 mL/min (ref >=60)
GFR, Est Non African American: 53 mL/min — ABNORMAL LOW (ref >=60)
Glucose, Bld: 93 mg/dL (ref 65–99)
Potassium: 4.4 mmol/L (ref 3.5–5.3)
SODIUM: 136 mmol/L (ref 135–146)

## 2015-04-26 ENCOUNTER — Ambulatory Visit (HOSPITAL_BASED_OUTPATIENT_CLINIC_OR_DEPARTMENT_OTHER)
Admission: RE | Admit: 2015-04-26 | Discharge: 2015-04-26 | Disposition: A | Discharge status: Home / Self Care | Payer: Medicare Other | Source: Ambulatory Visit | Attending: Family Medicine | Admitting: Family Medicine

## 2015-04-26 DIAGNOSIS — N289 Disorder of kidney and ureter, unspecified: Secondary | ICD-10-CM

## 2015-04-26 DIAGNOSIS — K862 Cyst of pancreas: Secondary | ICD-10-CM | POA: Insufficient documentation

## 2015-04-26 DIAGNOSIS — M545 Low back pain: Secondary | ICD-10-CM | POA: Diagnosis not present

## 2015-04-26 DIAGNOSIS — K7689 Other specified diseases of liver: Secondary | ICD-10-CM | POA: Diagnosis not present

## 2015-04-26 DIAGNOSIS — R109 Unspecified abdominal pain: Secondary | ICD-10-CM | POA: Diagnosis present

## 2015-04-26 DIAGNOSIS — K8689 Other specified diseases of pancreas: Secondary | ICD-10-CM | POA: Insufficient documentation

## 2015-04-26 MED ORDER — GADOBENATE DIMEGLUMINE 529 MG/ML IV SOLN
17.0000 mL | Freq: Once | INTRAVENOUS | Status: AC | PRN
  Administered 2015-04-26: 17 mL via INTRAVENOUS

## 2015-05-13 ENCOUNTER — Other Ambulatory Visit: Payer: Self-pay | Admitting: Family Medicine

## 2015-05-14 ENCOUNTER — Telehealth: Payer: Self-pay | Admitting: Family Medicine

## 2015-05-14 NOTE — Telephone Encounter (Signed)
Received phone call from [facility] with [facility], Amour Thyroid is NOT covered under Pt's insurance. This Rx is a plan exclusion. Will route to PCP for review on new Rx alternatives (no alternatives were listed on voicemail from insurance company).

## 2015-05-14 NOTE — Telephone Encounter (Signed)
Received fax for prior authorization on Amour Thyroid sent through cover my meds waiting on authorization. - CF

## 2015-05-15 MED ORDER — SYNTHROID 88 MCG PO TABS: 88.0000 ug | ORAL_TABLET | Freq: Every day | ORAL | Status: DC

## 2015-05-15 NOTE — Telephone Encounter (Signed)
Pt prefers brand name. Will route back to PCP for new Rx. Pharmacy is current in chart.

## 2015-05-15 NOTE — Telephone Encounter (Signed)
Call pt and see if she is ok with changing to branded Synthroid  Or generic levothyroxine.

## 2015-05-15 NOTE — Telephone Encounter (Signed)
Call patient: I will send her for branded Synthroid.These are not exactly equivalent so she will have to have her TSH rechecked in 6 weeks. Thank you.

## 2015-05-16 ENCOUNTER — Ambulatory Visit: Payer: Medicare Other | Admitting: Physician Assistant

## 2015-05-16 ENCOUNTER — Telehealth: Payer: Self-pay | Admitting: Family Medicine

## 2015-05-16 NOTE — Telephone Encounter (Signed)
Received fax for prior authorization on Synthroid sent through cover my meds waiting on authorization. - CF

## 2015-05-22 ENCOUNTER — Encounter: Payer: Self-pay | Admitting: Family Medicine

## 2015-05-22 ENCOUNTER — Ambulatory Visit (INDEPENDENT_AMBULATORY_CARE_PROVIDER_SITE_OTHER): Payer: Medicare Other | Admitting: Family Medicine

## 2015-05-22 VITALS — BP 160/64 | HR 119 | Ht 65.0 in | Wt 184.0 lb

## 2015-05-22 DIAGNOSIS — M4310 Spondylolisthesis, site unspecified: Secondary | ICD-10-CM

## 2015-05-22 DIAGNOSIS — E039 Hypothyroidism, unspecified: Secondary | ICD-10-CM | POA: Diagnosis not present

## 2015-05-22 DIAGNOSIS — M545 Low back pain: Secondary | ICD-10-CM

## 2015-05-22 DIAGNOSIS — R443 Hallucinations, unspecified: Secondary | ICD-10-CM

## 2015-05-22 DIAGNOSIS — M5136 Other intervertebral disc degeneration, lumbar region: Secondary | ICD-10-CM

## 2015-05-22 DIAGNOSIS — Q762 Congenital spondylolisthesis: Secondary | ICD-10-CM

## 2015-05-22 DIAGNOSIS — Z23 Encounter for immunization: Secondary | ICD-10-CM | POA: Diagnosis not present

## 2015-05-22 DIAGNOSIS — G8929 Other chronic pain: Secondary | ICD-10-CM

## 2015-05-22 MED ORDER — VILAZODONE HCL 40 MG PO TABS: ORAL_TABLET | ORAL | Status: DC

## 2015-05-22 MED ORDER — AMLODIPINE BESYLATE 10 MG PO TABS: 10.0000 mg | ORAL_TABLET | Freq: Every day | ORAL | Status: DC

## 2015-05-22 NOTE — Progress Notes (Signed)
Subjective:    Patient ID: Nicole Duffy, female    DOB: [birth year], 82 y.o.   MRN: [id number]  HPI  Having hallucinations at night. Not during the day.  Has to fight mentally with herself.  Says it was worse when he was on the tramadol but it has been better off it but they are still there. She is not sleeping well. She has only had a prior hx of depression and anxiety.  No recent still misses her illnesses. She did have a cough about 3 weeks ago that seemed to go away on its own. No fevers chills or sweats. No urinary symptoms.  Low back pain - it has been othering her or the last several eeks. She isgtting some paingoin down into hrght leg that occasinaly ause that right leg to fl wak. using Aleve one po BID.  gently had an MRI back in 2015 with Dr. Dianah Field. It showed some degenerative disc disease as well as some anterior listhesis of the lumbar spine. At that time she was getting treatment for her arthritis of her knees and they decided to put the back pain on the back burner for a while. She never followed back up with him.  Review of Systems  BP 160/64 mmHg  Pulse 119  Ht 5\' 5"  (1.651 m)  Wt 184 lb (83.462 kg)  BMI 30.62 kg/m2    Allergies  Allergen Reactions  . Augmentin [Amoxicillin-Pot Clavulanate] Other (See Comments)    Causes severe acid reflux  . Buspar [Buspirone] Other (See Comments)    Fatigue, foggy headed, insomnia  . Nortriptyline Other (See Comments)    Change in mental status  . Prednisone     REACTION: GI Upset  . Prozac [Fluoxetine Hcl] Other (See Comments)  . Quinine Derivatives Other (See Comments)  . Sulfa Antibiotics Other (See Comments)  . Sulfonamide Derivatives     REACTION: Hives, Rash  . Tramadol Other (See Comments)    hallucinatoin  . Tylenol [Acetaminophen] Other (See Comments)    Stomach pain  . Xanax [Alprazolam] Other (See Comments)    Hallucinations    Past Medical History  Diagnosis Date  . Benign essential HTN   .  Osteoporosis   . Insulin resistance syndrome   . Anemia, pernicious   . Anxiety   . Hypothyroidism   . Anxiety   . Depressed   . HLD (hyperlipidemia)   . GERD (gastroesophageal reflux disease)   . PUD (peptic ulcer disease)     remote  . Diastolic CHF ([place])   . Osteoarthritis, knee     s/p L TKA 10/2012  . Aftercare following joint replacement surgery     L TKA 10/2012    Past Surgical History  Procedure Laterality Date  . Hysterectomy (other)    . Tubal ligation    . Tonsillectomy    . L total knee arthroplasty Left 10/2012    Social History   Social History  . Marital Status: Divorced    Spouse Name: N/A  . Number of Children: N/A  . Years of Education: N/A   Occupational History  . Not on file.   Social History Main Topics  . Smoking status: Never Smoker   . Smokeless tobacco: Not on file  . Alcohol Use: No  . Drug Use: No  . Sexual Activity: Not on file   Other Topics Concern  . Not on file   Social History Narrative   Lives in assisted living center. Does not  exercise.     Family History  Problem Relation Age of Onset  . Diabetes Brother   . Colon cancer Other     1st degree relative <60  . Ovarian cancer Other   . Hypertension Brother   . Hyperlipidemia Brother   . Depression Brother     Outpatient Encounter Prescriptions as of 05/22/2015  Medication Sig  . amLODipine (NORVASC) 10 MG tablet Take 1 tablet (10 mg total) by mouth daily.  Marland Kitchen b complex vitamins tablet Take 1 tablet by mouth daily.  . metoprolol succinate (TOPROL-XL) 25 MG 24 hr tablet TAKE ONE TABLET DAILY  . ranitidine (ZANTAC) 300 MG tablet Take 300 mg by mouth 2 (two) times daily.  Marland Kitchen SYNTHROID 88 MCG tablet Take 1 tablet (88 mcg total) by mouth daily before breakfast.  . Vilazodone HCl (VIIBRYD) 40 MG TABS Take 1 tablet (40 mg total) by mouth daily.  . [DISCONTINUED] amLODipine (NORVASC) 10 MG tablet TAKE 1 TABLET DAILY.  . [DISCONTINUED] VIIBRYD 40 MG TABS Take 1 tablet (40 mg  total) by mouth daily.   No facility-administered encounter medications on file as of 05/22/2015.          Objective:   Physical Exam  Constitutional: She is oriented to person, place, and time. She appears well-developed and well-nourished.  HENT:  Head: Normocephalic and atraumatic.  Cardiovascular: Normal rate, regular rhythm and normal heart sounds.   Pulmonary/Chest: Effort normal and breath sounds normal.  Neurological: She is alert and oriented to person, place, and time.  Skin: Skin is warm and dry.  Psychiatric: She has a normal mood and affect. Her behavior is normal.          Assessment & Plan:  Hallucinations -will evaluate for any biochemical disturbance such as electrolytes etc. We'll recheck her TSH to make sure nothing is off. She is off of the Xanax and tramadol. The fibroid still could be causing the symptoms but she's been on this for a while so would be a little bit unusual. I discussed with her that really like to get a consult with psychiatry just to see if there any other things that they think could be causing it. I'm not convinced this is necessarily related to a mental illness per se. We'll also do a urinalysis to rule out UTI. She's not had any cough or cold symptoms.  Chronic low back pain - I recommend that she get back in with Dr. Dianah Field, for further treatment and evaluation in case pickup with a left off. She does have some significant findings on MRI that was done in 2015. She may possibly be a good candidate for injections. She is not a good candidate for chronic long-term NSAID use though right now that is mostly what she is using to control her pain. And she is definitely not a candidate for any type of narcotic medication as it does seem to trigger hallucinations for her.  Flu vaccine given today.

## 2015-05-23 ENCOUNTER — Encounter: Payer: Self-pay | Admitting: Family Medicine

## 2015-05-23 ENCOUNTER — Ambulatory Visit (INDEPENDENT_AMBULATORY_CARE_PROVIDER_SITE_OTHER): Payer: Medicare Other | Admitting: Sports Medicine

## 2015-05-23 VITALS — BP 146/72 | HR 93 | Temp 97.9°F | Resp 18 | Wt 188.7 lb

## 2015-05-23 DIAGNOSIS — M545 Low back pain, unspecified: Secondary | ICD-10-CM

## 2015-05-23 DIAGNOSIS — IMO0001 Reserved for inherently not codable concepts without codable children: Secondary | ICD-10-CM

## 2015-05-23 DIAGNOSIS — M609 Myositis, unspecified: Secondary | ICD-10-CM

## 2015-05-23 DIAGNOSIS — M791 Myalgia: Secondary | ICD-10-CM

## 2015-05-23 DIAGNOSIS — N183 Chronic kidney disease, stage 3 unspecified: Secondary | ICD-10-CM | POA: Insufficient documentation

## 2015-05-23 LAB — CBC WITH DIFFERENTIAL/PLATELET
Basophils Absolute: 0 10*3/uL (ref 0.0–0.1)
Basophils Relative: 0 % (ref 0–1)
EOS PCT: 0 % (ref 0–5)
Eosinophils Absolute: 0 10*3/uL (ref 0.0–0.7)
HEMATOCRIT: 43 % (ref 36.0–46.0)
HEMOGLOBIN: 14.1 g/dL (ref 12.0–15.0)
LYMPHS PCT: 30 % (ref 12–46)
Lymphs Abs: 3.6 10*3/uL (ref 0.7–4.0)
MCH: 28.4 pg (ref 26.0–34.0)
MCHC: 32.8 g/dL (ref 30.0–36.0)
MCV: 86.5 fL (ref 78.0–100.0)
MONO ABS: 0.8 10*3/uL (ref 0.1–1.0)
MONOS PCT: 7 % (ref 3–12)
MPV: 10.9 fL (ref 8.6–12.4)
NEUTROS ABS: 7.5 10*3/uL (ref 1.7–7.7)
Neutrophils Relative %: 63 % (ref 43–77)
Platelets: 363 10*3/uL (ref 150–400)
RBC: 4.97 MIL/uL (ref 3.87–5.11)
RDW: 13.6 % (ref 11.5–15.5)
WBC: 11.9 10*3/uL — AB (ref 4.0–10.5)

## 2015-05-23 LAB — COMPLETE METABOLIC PANEL WITH GFR
ALBUMIN: 4.1 g/dL (ref 3.6–5.1)
ALK PHOS: 111 U/L (ref 33–130)
ALT: 13 U/L (ref 6–29)
AST: 19 U/L (ref 10–35)
BILIRUBIN TOTAL: 1 mg/dL (ref 0.2–1.2)
BUN: 10 mg/dL (ref 7–25)
CO2: 24 mmol/L (ref 20–31)
Calcium: 9.8 mg/dL (ref 8.6–10.4)
Chloride: 98 mmol/L (ref 98–110)
Creat: 1.11 mg/dL — ABNORMAL HIGH (ref 0.60–0.88)
GFR, EST AFRICAN AMERICAN: 54 mL/min — AB (ref >=60)
GFR, EST NON AFRICAN AMERICAN: 47 mL/min — AB (ref >=60)
GLUCOSE: 124 mg/dL — AB (ref 65–99)
Potassium: 5 mmol/L (ref 3.5–5.3)
Sodium: 136 mmol/L (ref 135–146)
TOTAL PROTEIN: 6.5 g/dL (ref 6.1–8.1)

## 2015-05-23 LAB — URINALYSIS
BILIRUBIN URINE: NEGATIVE
GLUCOSE, UA: NEGATIVE
HGB URINE DIPSTICK: NEGATIVE
Nitrite: NEGATIVE
PH: 6 (ref 5.0–8.0)
SPECIFIC GRAVITY, URINE: 1.023 (ref 1.001–1.035)

## 2015-05-23 LAB — VITAMIN B12: VITAMIN B 12: 1776 pg/mL — AB (ref 211–911)

## 2015-05-23 LAB — TSH: TSH: 7.778 u[IU]/mL — ABNORMAL HIGH (ref 0.350–4.500)

## 2015-05-23 NOTE — Assessment & Plan Note (Signed)
Multilevel degenerative changes in the lumbar spine however pain today's referable to the sacroiliac joint. I injected her right sacraoiliac joint under ultrasound guidance and she reported 80% immediate relief of pain. This suggests that she still has 20% of her pain secondary to her degenerative disc disease. Next line return in one month for see up things are going.

## 2015-05-23 NOTE — Progress Notes (Signed)
  Subjective:    CC: low back pain  HPI: This is a pleasant 82 year old female with chronic low back pain, we initially started the workup and I was suspecting right sacroiliac joint dysfunction, we never completed the workup and she returns today for further evaluation and definitive treatment. Pain has stayed the same, localized over the right sacroiliac joint with radiation into the buttock but not past the knee. She did have an MRI that showed multilevel degenerative changes. She is eager for aggressive interventional treatment today. No bowel or bladder dysfunction to's new, no saddle numbness. No constitutional symptoms.  Past medical history, Surgical history, Family history not pertinant except as noted below, Social history, Allergies, and medications have been entered into the medical record, reviewed, and no changes needed.   Review of Systems: No fevers, chills, night sweats, weight loss, chest pain, or shortness of breath.   Objective:    General: Well Developed, well nourished, and in no acute distress.  Neuro: Alert and oriented x3, extra-ocular muscles intact, sensation grossly intact.  HEENT: Normocephalic, atraumatic, pupils equal round reactive to light, neck supple, no masses, no lymphadenopathy, thyroid nonpalpable.  Skin: Warm and dry, no rashes. Cardiac: Regular rate and rhythm, no murmurs rubs or gallops, no lower extremity edema.  Respiratory: Clear to auscultation bilaterally. Not using accessory muscles, speaking in full sentences.  Procedure: Real-time Ultrasound Guided Injection of Right sacroiliac joint Device: GE Logiq E  Verbal informed consent obtained.  Time-out conducted.  Noted no overlying erythema, induration, or other signs of local infection.  Skin prepped in a sterile fashion.  Local anesthesia: Topical Ethyl chloride.  With sterile technique and under real time ultrasound guidance:  Spinal needle advanced into the sacroiliac joint taking care to  avoid the S1 foramen, I injected 1 mL kenalog 40, 2 mL lidocaine, 2 mL Marcaine. Completed without difficulty  Pain immediately resolved approximately 80% suggesting accurate placement of the medication.  Advised to call if fevers/chills, erythema, induration, drainage, or persistent bleeding.  Images permanently stored and available for review in the ultrasound unit.  Impression: Technically successful ultrasound guided injection.  Impression and Recommendations:

## 2015-05-26 ENCOUNTER — Other Ambulatory Visit: Payer: Self-pay | Admitting: Family Medicine

## 2015-05-26 DIAGNOSIS — R82998 Other abnormal findings in urine: Secondary | ICD-10-CM

## 2015-05-27 ENCOUNTER — Telehealth: Payer: Self-pay | Admitting: Family Medicine

## 2015-05-27 NOTE — Telephone Encounter (Signed)
Per latest lab results: Pt son Ronalee Belts) called to state Pt is taking Armour Thyroid 60mg  daily not the synthroid that is on Pt's chart. Will route to PCP.  Previous lab result comments: Notes Recorded by Huel Cote, LPN on 075-GRM at 624THL PM Pt and Pt's son advised of results. Pt's son is going to check on what synthroid dose Pt is taking and return clinic call with this information. Was advised Pt should go down to lab and leave a specimen for urine culture. Pt son (who prepares all of Pt's meds) was also advised to eliminate any extra B12. He states Pt was getting a B12 supplement but he will discontinue this. Notes Recorded by Hali Marry, MD on 05/23/2015 at 10:32 PM Call pt: let patient know did have some protein and leuks in urine. See if she can give a urine sample for a culture. Notes Recorded by Narda Rutherford, CMA on 05/23/2015 at 3:48 PM Unable to add urine culture. Notes Recorded by Hali Marry, MD on 05/23/2015 at 1:11 PM Call lab and see if they can do a urine culture. Notes Recorded by Hali Marry, MD on 05/23/2015 at 7:59 AM Call patient: Her thyroid level is off. Please have her just double check to make sure that she is taking her medication consistently, and that she has not run out. Please also verify that she is taking 88 g tab. If she is taking it regularly about an hour before breakfast for maximal absorption then I need to go ahead and adjust her dose. Please let me know. Vitamin B12 is very high. This can also sometimes cause some neurologic changes. If she is taking anything and has extra B12 and it then she needs to stop it. She is taking a B complex so she needs to go ahead and stop that kidney function is mildly impaired but stable. The urinalysis is still pending. Hemoglobin looks great. No sign of anemia.

## 2015-05-27 NOTE — Telephone Encounter (Signed)
Information given to Freda Munro, verbalized understanding regarding new Rx (synthroid). Will begin her on that Rx then recheck TSH and B12 in 6 weeks.

## 2015-05-27 NOTE — Telephone Encounter (Signed)
Her insurance is not going to cover Armour Thyroid anymore. She may still be taking what flipped over of an old prescription. She needs to go ahead and switch over to the Synthroid. The pharmacy should have an up-to-date prescription on file for her area when she starts that we need to recheck her thyroid in 6 weeks. Will recheck her B12 at that time as well.

## 2015-06-03 ENCOUNTER — Other Ambulatory Visit: Payer: Self-pay | Admitting: Sports Medicine

## 2015-06-04 DIAGNOSIS — N390 Urinary tract infection, site not specified: Secondary | ICD-10-CM | POA: Diagnosis not present

## 2015-06-07 LAB — URINE CULTURE

## 2015-06-09 ENCOUNTER — Other Ambulatory Visit: Payer: Self-pay | Admitting: Family Medicine

## 2015-06-09 DIAGNOSIS — R300 Dysuria: Secondary | ICD-10-CM

## 2015-06-12 ENCOUNTER — Other Ambulatory Visit: Payer: Self-pay | Admitting: Sports Medicine

## 2015-06-13 ENCOUNTER — Encounter: Payer: Self-pay | Admitting: Family Medicine

## 2015-06-13 ENCOUNTER — Ambulatory Visit (INDEPENDENT_AMBULATORY_CARE_PROVIDER_SITE_OTHER): Payer: Medicare Other | Admitting: Family Medicine

## 2015-06-13 ENCOUNTER — Telehealth: Payer: Self-pay | Admitting: Family Medicine

## 2015-06-13 VITALS — BP 132/52 | HR 70

## 2015-06-13 DIAGNOSIS — N281 Cyst of kidney, acquired: Secondary | ICD-10-CM

## 2015-06-13 DIAGNOSIS — K219 Gastro-esophageal reflux disease without esophagitis: Secondary | ICD-10-CM

## 2015-06-13 DIAGNOSIS — E038 Other specified hypothyroidism: Secondary | ICD-10-CM

## 2015-06-13 DIAGNOSIS — F32A Depression, unspecified: Secondary | ICD-10-CM

## 2015-06-13 DIAGNOSIS — F411 Generalized anxiety disorder: Secondary | ICD-10-CM | POA: Diagnosis not present

## 2015-06-13 DIAGNOSIS — R5383 Other fatigue: Secondary | ICD-10-CM | POA: Diagnosis not present

## 2015-06-13 DIAGNOSIS — Q6100 Congenital renal cyst, unspecified: Secondary | ICD-10-CM

## 2015-06-13 DIAGNOSIS — F329 Major depressive disorder, single episode, unspecified: Secondary | ICD-10-CM

## 2015-06-13 NOTE — Telephone Encounter (Signed)
Please call Dr. Tora Duck, Gastroenterology office for last office visit note. The last one I have and the system is from April and I did not see a new or no Inc[employer] care everywhere.

## 2015-06-13 NOTE — Progress Notes (Signed)
Subjective:    Patient ID: Nicole Duffy, female    DOB: 10-Jul-1931, 82 y.o.   MRN: [id number]  HPI Depression - here for follow-up depression I saw her about 3 weeks ago. She's currently on Viibryd. We tried several other medications in the past and had slowly weaned her benzodiazepines over the last year and a half and she is now completely off of them. She says she still feels down and depressed almost every day and has thoughts of not wanting to be here. She says she just feels so tired and fatigued all the time. She's not very active and is mostly sedentary during the daytime. She is here with a friend today. She also reports feeling like her head gets fuzzy at times and when this happened she starts to see things. This happens mostly in the evenings or at night when she goes to bed. She's been having these hallucinations on and off for the last several months. We have stopped several possibly offending medications and it has improved but has not completely resolved. In fact I saw her 3 weeks ago we had discussed referral to psychiatry for further evaluation but she had declined. She was here with her son that day. Because she feels like her son and daughter-in-law try to tell her what to do. She is wanting to restart her Xanax at bedtime to see if that helps with her symptoms.  Insomnia - she quit taking her melatonin and noticed the hallucinations were better.    She is still feeling very nauseated after stopping the Protonix. She was switched to ranitidine. She just restarted the per.Protonix this week. So our remove the ranitidine from her medication list. We have tried to wean her off this medication because of increased risk of fractures etc.   Review of Systems  BP 132/52 mmHg  Pulse 70  SpO2 96%    Allergies  Allergen Reactions  . Augmentin [Amoxicillin-Pot Clavulanate] Other (See Comments)    Causes severe acid reflux  . Buspar [Buspirone] Other (See Comments)    Fatigue, foggy  headed, insomnia  . Nortriptyline Other (See Comments)    Change in mental status  . Prednisone     REACTION: GI Upset  . Prozac [Fluoxetine Hcl] Other (See Comments)  . Quinine Derivatives Other (See Comments)  . Sulfa Antibiotics Other (See Comments)  . Sulfonamide Derivatives     REACTION: Hives, Rash  . Tramadol Other (See Comments)    hallucinatoin  . Tylenol [Acetaminophen] Other (See Comments)    Stomach pain  . Xanax [Alprazolam] Other (See Comments)    Hallucinations    Past Medical History  Diagnosis Date  . Benign essential HTN   . Osteoporosis   . Insulin resistance syndrome   . Anemia, pernicious   . Anxiety   . Hypothyroidism   . Anxiety   . Depressed   . HLD (hyperlipidemia)   . GERD (gastroesophageal reflux disease)   . PUD (peptic ulcer disease)     remote  . Diastolic CHF ([place])   . Osteoarthritis, knee     s/p L TKA 10/2012  . Aftercare following joint replacement surgery     L TKA 10/2012    Past Surgical History  Procedure Laterality Date  . Hysterectomy (other)    . Tubal ligation    . Tonsillectomy    . L total knee arthroplasty Left 10/2012    Social History   Social History  . Marital Status: Divorced  Spouse Name: N/A  . Number of Children: N/A  . Years of Education: N/A   Occupational History  . Not on file.   Social History Main Topics  . Smoking status: Never Smoker   . Smokeless tobacco: Not on file  . Alcohol Use: No  . Drug Use: No  . Sexual Activity: Not on file   Other Topics Concern  . Not on file   Social History Narrative   Lives in assisted living center. Does not exercise.     Family History  Problem Relation Age of Onset  . Diabetes Brother   . Colon cancer Other     1st degree relative <60  . Ovarian cancer Other   . Hypertension Brother   . Hyperlipidemia Brother   . Depression Brother     Outpatient Encounter Prescriptions as of 06/13/2015  Medication Sig  . amLODipine (NORVASC) 10 MG tablet  Take 1 tablet (10 mg total) by mouth daily.  Marland Kitchen b complex vitamins tablet Take 1 tablet by mouth daily.  . metoprolol succinate (TOPROL-XL) 25 MG 24 hr tablet TAKE ONE TABLET DAILY  . pantoprazole (PROTONIX) 40 MG tablet TAKE ONE TABLET TWICE DAILY BEFORE A A MEAL  . SYNTHROID 88 MCG tablet Take 1 tablet (88 mcg total) by mouth daily before breakfast.  . Vilazodone HCl (VIIBRYD) 40 MG TABS Take 1 tablet (40 mg total) by mouth daily.  . [DISCONTINUED] ranitidine (ZANTAC) 300 MG tablet TAKE ONE TABLET TWICE DAILY   No facility-administered encounter medications on file as of 06/13/2015.          Objective:   Physical Exam  Constitutional: She is oriented to person, place, and time. She appears well-developed and well-nourished.  HENT:  Head: Normocephalic and atraumatic.  Cardiovascular: Normal rate, regular rhythm and normal heart sounds.   Pulmonary/Chest: Effort normal and breath sounds normal.  Neurological: She is alert and oriented to person, place, and time.  Skin: Skin is warm and dry.  Psychiatric: She has a normal mood and affect. Her behavior is normal.          Assessment & Plan:  Depression -pHQ 9 score of 23 and dad 7 score of 15. She rates her symptoms as very difficult. I discussed with her that really like her to get a consult with psychiatry. I really think this would be the most helpful thing to determine if she may have a new diagnosis or if some of this still could be medication related. Her symptoms are not well controlled right now on the Viibryd, but this is the medication that she has really done the best with.   Fatigue-may be related to her recent elevation in TSH. We actually called her son to confirm her dose before making any adjustments but we had not heard back from him to confirm the dosing.We will try to give him a call again on Monday as she will likely need to be increased to 100 g daily and then recheck level in 6-8 weeks.  Insomnia - discontinue  melatonin since that seemsllucinations. I really want to avoid putting her on anything sedating and that could increase her risk of falls.  GERD- he is back on the Protonix. Hopefully this will improve her nausea. Actually need to get the most up-to-date report from gastroenterology. The last one I have was from April.  Left renal cyst-was stable on last report in December. She will need repeat scan in December of next year and reviewed this with  her.

## 2015-06-16 ENCOUNTER — Ambulatory Visit (INDEPENDENT_AMBULATORY_CARE_PROVIDER_SITE_OTHER): Payer: Medicare Other | Admitting: Sports Medicine

## 2015-06-16 ENCOUNTER — Encounter: Payer: Self-pay | Admitting: Sports Medicine

## 2015-06-16 VITALS — BP 130/84 | HR 100 | Resp 18

## 2015-06-16 DIAGNOSIS — M609 Myositis, unspecified: Secondary | ICD-10-CM | POA: Diagnosis not present

## 2015-06-16 DIAGNOSIS — M545 Low back pain, unspecified: Secondary | ICD-10-CM

## 2015-06-16 DIAGNOSIS — IMO0001 Reserved for inherently not codable concepts without codable children: Secondary | ICD-10-CM

## 2015-06-16 DIAGNOSIS — M791 Myalgia: Secondary | ICD-10-CM

## 2015-06-16 NOTE — Assessment & Plan Note (Signed)
Good response to left sacral iliac joint injection at the last visit, now desires injection into the right Sacroiliac joint. Return to see me in one month.

## 2015-06-16 NOTE — Progress Notes (Signed)
  Subjective:    CC: Low back pain  HPI: Good response to left sacroiliac joint injection at the last visit, now desires repeat injection on the right side. Pain is severe, persistent, localized at the [facility] joint without radiation.  Past medical history, Surgical history, Family history not pertinant except as noted below, Social history, Allergies, and medications have been entered into the medical record, reviewed, and no changes needed.   Review of Systems: No fevers, chills, night sweats, weight loss, chest pain, or shortness of breath.   Objective:    General: Well Developed, well nourished, and in no acute distress.  Neuro: Alert and oriented x3, extra-ocular muscles intact, sensation grossly intact.  HEENT: Normocephalic, atraumatic, pupils equal round reactive to light, neck supple, no masses, no lymphadenopathy, thyroid nonpalpable.  Skin: Warm and dry, no rashes. Cardiac: Regular rate and rhythm, no murmurs rubs or gallops, no lower extremity edema.  Respiratory: Clear to auscultation bilaterally. Not using accessory muscles, speaking in full sentences.  Procedure: Real-time Ultrasound Guided Injection of right sacroiliac joint Device: GE Logiq E  Verbal informed consent obtained.  Time-out conducted.  Noted no overlying erythema, induration, or other signs of local infection.  Skin prepped in a sterile fashion.  Local anesthesia: Topical Ethyl chloride.  With sterile technique and under real time ultrasound guidance:  Spinal needle advanced into the joint and 1 mL kenalog 40, 2 mL lidocaine, 2 mL Marcaine injected easily. Completed without difficulty  Pain immediately resolved suggesting accurate placement of the medication.  Advised to call if fevers/chills, erythema, induration, drainage, or persistent bleeding.  Images permanently stored and available for review in the ultrasound unit.  Impression: Technically successful ultrasound guided injection.  Impression and  Recommendations:

## 2015-06-18 NOTE — Telephone Encounter (Signed)
Spoke with medical records at Dr. Donata Clay office. Pt has not been seen since 4/16.

## 2015-06-22 NOTE — Telephone Encounter (Signed)
Call pt: let her know we verified that she was last seenin April by Dr. Glennon Hamilton. Since still having abdominal pain I would recommend she make an appointment. We can place new referral if needed.

## 2015-06-23 ENCOUNTER — Ambulatory Visit: Payer: Medicare Other | Admitting: Family Medicine

## 2015-06-23 ENCOUNTER — Ambulatory Visit: Payer: Medicare Other | Admitting: Sports Medicine

## 2015-06-24 NOTE — Telephone Encounter (Signed)
Pt states she does still have abdominal pain but does not want a new referral at this time. Advised her to contact our office if she changes her mind. Verbalized understanding.

## 2015-06-25 ENCOUNTER — Ambulatory Visit (HOSPITAL_COMMUNITY): Payer: Medicare Other | Admitting: Psychiatry

## 2015-06-25 NOTE — Telephone Encounter (Signed)
Synthroid has been denied. - CF

## 2015-07-07 ENCOUNTER — Ambulatory Visit: Payer: Medicare Other | Admitting: Family Medicine

## 2015-07-07 ENCOUNTER — Ambulatory Visit (HOSPITAL_COMMUNITY): Payer: Medicare Other | Admitting: Psychiatry

## 2015-07-25 ENCOUNTER — Ambulatory Visit: Payer: Medicare Other | Admitting: Osteopathic Medicine

## 2015-07-25 ENCOUNTER — Ambulatory Visit: Payer: Medicare Other | Admitting: Family Medicine

## 2015-07-28 DIAGNOSIS — R600 Localized edema: Secondary | ICD-10-CM | POA: Diagnosis not present

## 2015-07-28 DIAGNOSIS — Z79899 Other long term (current) drug therapy: Secondary | ICD-10-CM | POA: Diagnosis not present

## 2015-07-28 DIAGNOSIS — E039 Hypothyroidism, unspecified: Secondary | ICD-10-CM | POA: Diagnosis not present

## 2015-07-28 DIAGNOSIS — Z888 Allergy status to other drugs, medicaments and biological substances status: Secondary | ICD-10-CM | POA: Diagnosis not present

## 2015-07-28 DIAGNOSIS — N3000 Acute cystitis without hematuria: Secondary | ICD-10-CM | POA: Diagnosis not present

## 2015-07-28 DIAGNOSIS — Z881 Allergy status to other antibiotic agents status: Secondary | ICD-10-CM | POA: Diagnosis not present

## 2015-07-28 DIAGNOSIS — D510 Vitamin B12 deficiency anemia due to intrinsic factor deficiency: Secondary | ICD-10-CM | POA: Diagnosis not present

## 2015-07-28 DIAGNOSIS — E782 Mixed hyperlipidemia: Secondary | ICD-10-CM | POA: Diagnosis not present

## 2015-07-28 DIAGNOSIS — K219 Gastro-esophageal reflux disease without esophagitis: Secondary | ICD-10-CM | POA: Diagnosis not present

## 2015-07-28 DIAGNOSIS — R9431 Abnormal electrocardiogram [ECG] [EKG]: Secondary | ICD-10-CM | POA: Diagnosis not present

## 2015-07-28 DIAGNOSIS — E871 Hypo-osmolality and hyponatremia: Secondary | ICD-10-CM | POA: Diagnosis not present

## 2015-07-28 DIAGNOSIS — Z882 Allergy status to sulfonamides status: Secondary | ICD-10-CM | POA: Diagnosis not present

## 2015-07-28 DIAGNOSIS — R079 Chest pain, unspecified: Secondary | ICD-10-CM | POA: Diagnosis not present

## 2015-07-28 DIAGNOSIS — I10 Essential (primary) hypertension: Secondary | ICD-10-CM | POA: Diagnosis not present

## 2015-07-28 LAB — CBC AND DIFFERENTIAL
HCT: 39 % (ref 36–46)
Hemoglobin: 12.9 g/dL (ref 12.0–16.0)
NEUTROS ABS: 5 /uL
PLATELETS: 288 10*3/uL (ref 150–399)
WBC: 7.9 10*3/mL

## 2015-07-28 LAB — POCT INR: INR: 0.8 — AB (ref 0.9–1.1)

## 2015-07-28 LAB — HEPATIC FUNCTION PANEL
ALT: 12 U/L (ref 7–35)
AST: 18 U/L (ref 13–35)
Alkaline Phosphatase: 105 U/L (ref 25–125)
BILIRUBIN, TOTAL: 0.6 mg/dL

## 2015-07-28 LAB — BASIC METABOLIC PANEL
BUN: 10 mg/dL (ref 4–21)
Creatinine: 0.9 mg/dL (ref 0.5–1.1)
GLUCOSE: 120 mg/dL
Potassium: 4.4 mmol/L (ref 3.4–5.3)
SODIUM: 126 mmol/L — AB (ref 137–147)

## 2015-07-28 LAB — PROTIME-INR: Protime: 9.2 seconds — AB (ref 10.0–13.8)

## 2015-07-28 LAB — TROPONIN T: Troponin T: <0.01

## 2015-07-28 LAB — TSH: TSH: 5.17 u[IU]/mL (ref 0.41–5.90)

## 2015-07-29 ENCOUNTER — Telehealth: Payer: Self-pay | Admitting: Family Medicine

## 2015-07-29 DIAGNOSIS — E871 Hypo-osmolality and hyponatremia: Secondary | ICD-10-CM

## 2015-07-29 NOTE — Telephone Encounter (Signed)
Ok to go to lab.  Make sure has cut back on water intak some.

## 2015-07-29 NOTE — Telephone Encounter (Signed)
Order placed and Pt notified.

## 2015-07-29 NOTE — Telephone Encounter (Signed)
Pt states she went to [facility] ED yesterday regarding sodium levels. Upon review on Care Everywhere, Pt's NA level was 126. Pt states they advised her to have it rechecked by the end of the week. Pt questions if she needs to follow up with PCP or can just go to the lab. Will route for review.

## 2015-07-31 ENCOUNTER — Other Ambulatory Visit: Payer: Self-pay | Admitting: Family Medicine

## 2015-08-01 ENCOUNTER — Ambulatory Visit (HOSPITAL_COMMUNITY): Payer: Medicare Other | Admitting: Psychiatry

## 2015-08-01 DIAGNOSIS — E871 Hypo-osmolality and hyponatremia: Secondary | ICD-10-CM | POA: Diagnosis not present

## 2015-08-02 LAB — BASIC METABOLIC PANEL WITH GFR
BUN: 16 mg/dL (ref 7–25)
CALCIUM: 9.2 mg/dL (ref 8.6–10.4)
CHLORIDE: 98 mmol/L (ref 98–110)
CO2: 26 mmol/L (ref 20–31)
CREATININE: 1.06 mg/dL — AB (ref 0.60–0.88)
GFR, Est African American: 57 mL/min — ABNORMAL LOW (ref >=60)
GFR, Est Non African American: 49 mL/min — ABNORMAL LOW (ref >=60)
GLUCOSE: 117 mg/dL — AB (ref 65–99)
Potassium: 4.4 mmol/L (ref 3.5–5.3)
SODIUM: 136 mmol/L (ref 135–146)

## 2015-08-03 NOTE — Telephone Encounter (Signed)
Quick Note:  All labs are normal. ______ 

## 2015-08-08 ENCOUNTER — Encounter: Payer: Self-pay | Admitting: Family Medicine

## 2015-08-16 ENCOUNTER — Other Ambulatory Visit: Payer: Self-pay | Admitting: Family Medicine

## 2015-09-24 ENCOUNTER — Other Ambulatory Visit: Payer: Self-pay | Admitting: Family Medicine

## 2015-10-02 ENCOUNTER — Telehealth: Payer: Self-pay

## 2015-10-02 DIAGNOSIS — H269 Unspecified cataract: Secondary | ICD-10-CM

## 2015-10-02 NOTE — Telephone Encounter (Signed)
Sent referral for ophthalmology.

## 2015-10-31 ENCOUNTER — Other Ambulatory Visit: Payer: Self-pay | Admitting: Family Medicine

## 2015-11-11 ENCOUNTER — Other Ambulatory Visit: Payer: Self-pay | Admitting: Family Medicine

## 2015-11-12 ENCOUNTER — Telehealth: Payer: Self-pay | Admitting: Family Medicine

## 2015-11-12 NOTE — Telephone Encounter (Signed)
Talk to patient about scheduling an appointment for f/u on BP and Thyroid and she states she will call back. Thanks

## 2015-12-03 LAB — HM DIABETES EYE EXAM

## 2015-12-04 ENCOUNTER — Encounter: Payer: Self-pay | Admitting: Family Medicine

## 2015-12-04 DIAGNOSIS — C6932 Malignant neoplasm of left choroid: Secondary | ICD-10-CM | POA: Insufficient documentation

## 2015-12-11 ENCOUNTER — Other Ambulatory Visit: Payer: Self-pay | Admitting: Family Medicine

## 2015-12-26 ENCOUNTER — Other Ambulatory Visit: Payer: Self-pay | Admitting: Family Medicine

## 2016-01-26 ENCOUNTER — Other Ambulatory Visit: Payer: Self-pay | Admitting: Family Medicine

## 2016-01-27 ENCOUNTER — Other Ambulatory Visit: Payer: Self-pay | Admitting: Family Medicine

## 2016-02-10 ENCOUNTER — Other Ambulatory Visit: Payer: Self-pay | Admitting: Family Medicine

## 2016-02-12 ENCOUNTER — Telehealth: Payer: Self-pay | Admitting: *Deleted

## 2016-02-12 NOTE — Telephone Encounter (Signed)
Call pt and see if would be willing to change to Synthroid as her insurance won't cover armour thyroid

## 2016-02-12 NOTE — Telephone Encounter (Signed)
PA initiated through covermymeds .Key: [id number]  Outcome  N/Atoday  WILL REWORK CASE FOR CORRECT PROCESS

## 2016-02-12 NOTE — Telephone Encounter (Signed)
Called insurance to see what the message received in covermymeds meant. The medication is not eligible for coverage on this members plan

## 2016-02-12 NOTE — Telephone Encounter (Signed)
Letter will be faxed to or office. Please advise

## 2016-02-17 NOTE — Telephone Encounter (Signed)
Called the patient and she is no longer a patient here and is under the care of another physican

## 2016-02-24 ENCOUNTER — Telehealth: Payer: Self-pay | Admitting: Family Medicine

## 2016-02-24 NOTE — Telephone Encounter (Signed)
I had called pt's son- Ronalee Belts who is her transportation and without his knowledge, his mother has switched her pcp- I was going to inform pt that she is due for a f/u on her Thyroid. He states he is trying to talk his mom into coming back here for care.

## 2016-03-04 ENCOUNTER — Other Ambulatory Visit: Payer: Self-pay | Admitting: Family Medicine

## 2016-04-16 ENCOUNTER — Telehealth: Payer: Self-pay | Admitting: Family Medicine

## 2016-04-16 NOTE — Telephone Encounter (Signed)
Pt's son advised of recommendation. States he wants to wait until Pt has appt with PCP next week. Stressed the importance of proper evaluation of this chest pain now, son states "if it gets worse I'll take her." no further questions.

## 2016-04-16 NOTE — Telephone Encounter (Signed)
Please call pt and see if we can get her to go to ED if sounds potentially cardiac

## 2016-04-16 NOTE — Telephone Encounter (Signed)
pts daughter-in-law mentioned pt is having chest pain. Advised her to speak with a nurse, but said it wasn't serious enough.

## 2016-04-22 ENCOUNTER — Ambulatory Visit (INDEPENDENT_AMBULATORY_CARE_PROVIDER_SITE_OTHER): Payer: Medicare Other | Admitting: Family Medicine

## 2016-04-22 ENCOUNTER — Encounter: Payer: Self-pay | Admitting: Family Medicine

## 2016-04-22 ENCOUNTER — Ambulatory Visit: Payer: Medicare Other | Admitting: Family Medicine

## 2016-04-22 ENCOUNTER — Ambulatory Visit (INDEPENDENT_AMBULATORY_CARE_PROVIDER_SITE_OTHER): Payer: Medicare Other

## 2016-04-22 VITALS — BP 127/63 | HR 45 | Temp 98.5°F

## 2016-04-22 DIAGNOSIS — K862 Cyst of pancreas: Secondary | ICD-10-CM

## 2016-04-22 DIAGNOSIS — J9811 Atelectasis: Secondary | ICD-10-CM

## 2016-04-22 DIAGNOSIS — I5189 Other ill-defined heart diseases: Secondary | ICD-10-CM

## 2016-04-22 DIAGNOSIS — R5381 Other malaise: Secondary | ICD-10-CM

## 2016-04-22 DIAGNOSIS — I519 Heart disease, unspecified: Secondary | ICD-10-CM | POA: Diagnosis not present

## 2016-04-22 DIAGNOSIS — N183 Chronic kidney disease, stage 3 unspecified: Secondary | ICD-10-CM

## 2016-04-22 DIAGNOSIS — I10 Essential (primary) hypertension: Secondary | ICD-10-CM | POA: Diagnosis not present

## 2016-04-22 DIAGNOSIS — M25473 Effusion, unspecified ankle: Secondary | ICD-10-CM

## 2016-04-22 NOTE — Progress Notes (Signed)
Nicole Duffy is a 82 y.o. female who presents to [facility] today for leg swelling.  Patient has transitional care away from Dr Madilyn Fireman recently to a physician at [facility] health care. She was started on hydrochlorothiazide, Seroquel, and lorazepam. She wishes to resume health care at this facility with Dr Madilyn Fireman as her PCP .  Her chief complaints today are bilateral ankle swelling and mild shortness of breath.  She has had bilateral ankle swelling for the past months, along with shortness of breath and dry cough. This past week, she has had worsening ankle swelling along with pain and numbness. She has also felt fatigued in the past week, has had a sensation of constant chest pressure but no pain, and has had a few episodes of reflux (normally well-controlled on PPI). No orthopnea, bowel or urinary changes. She denies any significant changes in medications aside from what is listed above. She has not had this issue worked up yet.  She has been using a wheelchair for the past 4 months due to back and knee pain. She was previously using a walker. Lives in assisted living facility.   Past Medical History:  Diagnosis Date  . Aftercare following joint replacement surgery    L TKA 10/2012  . Anemia, pernicious   . Anxiety   . Anxiety   . Benign essential HTN   . Depressed   . Diastolic CHF ([place])   . GERD (gastroesophageal reflux disease)   . HLD (hyperlipidemia)   . Hypothyroidism   . Insulin resistance syndrome   . Osteoarthritis, knee    s/p L TKA 10/2012  . Osteoporosis   . PUD (peptic ulcer disease)    remote   Past Surgical History:  Procedure Laterality Date  . hysterectomy (other)    . L Total Knee Arthroplasty Left 10/2012  . TONSILLECTOMY    . TUBAL LIGATION     Social History  Substance Use Topics  . Smoking status: Never Smoker  . Smokeless  tobacco: Not on file  . Alcohol use No   family history includes Colon cancer in her other; Depression in her brother; Diabetes in her brother; Hyperlipidemia in her brother; Hypertension in her brother; Ovarian cancer in her other.  ROS as above:  Medications: Current Outpatient Prescriptions  Medication Sig Dispense Refill  . hydrochlorothiazide (HYDRODIURIL) 25 MG tablet Take by mouth.    . levothyroxine (SYNTHROID, LEVOTHROID) 75 MCG tablet Take by mouth.    . loperamide (IMODIUM) 2 MG capsule Take by mouth.    Marland Kitchen LORazepam (ATIVAN) 0.5 MG tablet Take by mouth.    . QUEtiapine (SEROQUEL) 100 MG tablet Take by mouth.    . ranitidine (ZANTAC) 150 MG tablet Take by mouth.    Marland Kitchen amLODipine (NORVASC) 10 MG tablet Take 1 tablet (10 mg total) by mouth daily. *NO ADDITIONAL REFILLS. PLEASE CALL THE OFFICE TO SCHEDULE AN APPOINTMENT* 30 tablet 0  . b complex vitamins tablet Take 1 tablet by mouth daily.    . Cholecalciferol (VITAMIN D3) 1000 units CAPS Take by mouth.    Marland Kitchen  metoprolol succinate (TOPROL-XL) 25 MG 24 hr tablet TAKE 1 TABLET BY MOUTH EVERY DAY NEED OFFICE VISIT BEFORE ANY MORE REFILLS 15 tablet 0  . mineral/vitamin supplement (MULTIGEN) 70 MG TABS tablet Take by mouth.    . pantoprazole (PROTONIX) 40 MG tablet TAKE ONE TABLET TWICE DAILY BEFORE A A MEAL 180 tablet 3  . thyroid (ARMOUR THYROID) 60 MG tablet Take 1 tablet (60 mg total) by mouth daily before breakfast. *THIS IS THE LAST REFILL. PLEASE CALL OFFICE TO SCHEDULE AN APPOINTMENT* 30 tablet 0  . VIIBRYD 40 MG TABS TAKE 1 TABLET EVERY DAY 30 tablet 6   No current facility-administered medications for this visit.    Allergies  Allergen Reactions  . Augmentin [Amoxicillin-Pot Clavulanate] Other (See Comments)    Causes severe acid reflux  . Buspar [Buspirone] Other (See Comments)    Fatigue, foggy headed, insomnia  . Nortriptyline Other (See Comments)    Change in mental status  . Prednisone     REACTION: GI Upset  .  Prozac [Fluoxetine Hcl] Other (See Comments)  . Quinine Derivatives Other (See Comments)  . Sulfa Antibiotics Other (See Comments)  . Sulfonamide Derivatives     REACTION: Hives, Rash  . Tramadol Other (See Comments)    hallucinatoin  . Tylenol [Acetaminophen] Other (See Comments)    Stomach pain  . Xanax [Alprazolam] Other (See Comments)    Hallucinations    Health Maintenance Health Maintenance  Topic Date Due  . DEXA SCAN  10/05/1998  . PNA vac Low Risk Adult (2 of 2 - PPSV23) 03/09/2015  . TETANUS/TDAP  05/25/2021  . INFLUENZA VACCINE  Addressed  . ZOSTAVAX  Addressed     Exam:  BP 127/63   Pulse (!) 45   Temp 98.5 F (36.9 C) (Oral)  Gen: Frail-appearing, sitting in wheelchair, NAD HEENT: EOMI,  MMM Lungs: Normal work of breathing. CTABL Heart: RRR no MRG, no JVD appreciated Abd: NABS, Soft. Nondistended, RUQ mildly ttp Exts: Warm and well perfused. Bilateral edema to the knees, pitting over the ankles. Overlying skin dry and erythematous.       Assessment and Plan: 82 y.o.  female with worsening bilateral leg swelling. Etiology may be multifactorial given her complex medical history, including stage III CKD, diastolic dysfunction, hypothyroidism, lymphedema. Concerning for CHF given associated cough and SOB; consider Lasix pending workup. Furthermore, she is deconditioned from being in a wheelchair for the past 4 months and will benefit from physical therapy.  - Labs as below - CXR - ECHO -Home PT at assisted living facility for walking - Follow up w/ Dr. Madilyn Fireman  Code status was discussed today and the patient wishes to be DNR/DNI.  Orders Placed This Encounter  Procedures  . DG Chest 2 View    Order Specific Question:   Reason for exam:    Answer:   Cough, assess intra-thoracic pathology    Order Specific Question:   Preferred imaging location?    Answer:   [facility]  . CBC  . COMPLETE METABOLIC PANEL WITH GFR  . B Nat Peptide  . TSH    . Lipase  . ECHOCARDIOGRAM COMPLETE    Standing Status:   Future    Standing Expiration Date:   07/21/2017    Order Specific Question:   Where should this test be performed    Answer:   [facility]    Order Specific Question:   Complete or Limited study?    Answer:   Complete  Order Specific Question:   Does the patient have a known history of hypersensitivity to Perflutren (Chief Technology Officer for echocardiograms - CHECK ALLERGIES)    Answer:   No    Order Specific Question:   ADMINISTER PERFLUTERN    Answer:   ADMINISTER PERFLUTREN    Order Specific Question:   Expected Date:    Answer:   1 month    Order Specific Question:   Reason for exam-Echo    Answer:   Other - See Comments Section   I spent 40 minutes with this patient, greater than 50% was face-to-face time counseling regarding the above diagnosis.  Discussed warning signs or symptoms. Please see discharge instructions. Patient expresses understanding.  This patient was seen and interviewed and examined independently by myself. Lynne Leader, MD

## 2016-04-22 NOTE — Patient Instructions (Addendum)
Thank you for coming in today. You should hear from home physical therapy soon.  Get labs and xray today.  You should hear about the heart ultrasound soon.  Schedule a follow up appointment with Dr Madilyn Fireman soon (less than 1 month).

## 2016-04-23 LAB — CBC
HCT: 38 % (ref 35.0–45.0)
HEMOGLOBIN: 12.5 g/dL (ref 11.7–15.5)
MCH: 28.5 pg (ref 27.0–33.0)
MCHC: 32.9 g/dL (ref 32.0–36.0)
MCV: 86.8 fL (ref 80.0–100.0)
MPV: 10.9 fL (ref 7.5–12.5)
PLATELETS: 283 10*3/uL (ref 140–400)
RBC: 4.38 MIL/uL (ref 3.80–5.10)
RDW: 14.2 % (ref 11.0–15.0)
WBC: 8.8 10*3/uL (ref 3.8–10.8)

## 2016-04-23 LAB — COMPLETE METABOLIC PANEL WITH GFR
ALT: 10 U/L (ref 6–29)
AST: 16 U/L (ref 10–35)
Albumin: 3.7 g/dL (ref 3.6–5.1)
Alkaline Phosphatase: 90 U/L (ref 33–130)
BUN: 11 mg/dL (ref 7–25)
CO2: 27 mmol/L (ref 20–31)
Calcium: 9.3 mg/dL (ref 8.6–10.4)
Chloride: 91 mmol/L — ABNORMAL LOW (ref 98–110)
Creat: 1.25 mg/dL — ABNORMAL HIGH (ref 0.60–0.88)
GFR, EST NON AFRICAN AMERICAN: 40 mL/min — AB (ref >=60)
GFR, Est African American: 46 mL/min — ABNORMAL LOW (ref >=60)
GLUCOSE: 99 mg/dL (ref 65–99)
POTASSIUM: 3.6 mmol/L (ref 3.5–5.3)
SODIUM: 130 mmol/L — AB (ref 135–146)
TOTAL PROTEIN: 6 g/dL — AB (ref 6.1–8.1)
Total Bilirubin: 0.7 mg/dL (ref 0.2–1.2)

## 2016-04-23 LAB — TSH: TSH: 2.75 m[IU]/L

## 2016-04-23 LAB — BRAIN NATRIURETIC PEPTIDE: Brain Natriuretic Peptide: 80.4 pg/mL (ref <100)

## 2016-04-23 LAB — LIPASE: Lipase: 30 U/L (ref 7–60)

## 2016-04-23 MED ORDER — CEFDINIR 300 MG PO CAPS: 300.0000 mg | ORAL_CAPSULE | Freq: Two times a day (BID) | ORAL | 0 refills | Status: DC

## 2016-04-23 NOTE — Addendum Note (Signed)
Addended by: Gregor Hams on: 04/23/2016 01:15 PM   Modules accepted: Orders

## 2016-04-29 ENCOUNTER — Telehealth: Payer: Self-pay | Admitting: *Deleted

## 2016-04-29 NOTE — Telephone Encounter (Signed)
Gave VO for continued PT for pt.Nicole Duffy [place]

## 2016-05-20 ENCOUNTER — Ambulatory Visit: Payer: Medicare Other | Admitting: Family Medicine

## 2016-05-20 DIAGNOSIS — N183 Chronic kidney disease, stage 3 (moderate): Secondary | ICD-10-CM

## 2016-05-20 DIAGNOSIS — J189 Pneumonia, unspecified organism: Secondary | ICD-10-CM | POA: Diagnosis not present

## 2016-05-20 DIAGNOSIS — I129 Hypertensive chronic kidney disease with stage 1 through stage 4 chronic kidney disease, or unspecified chronic kidney disease: Secondary | ICD-10-CM

## 2016-05-20 DIAGNOSIS — M6281 Muscle weakness (generalized): Secondary | ICD-10-CM

## 2016-05-24 ENCOUNTER — Ambulatory Visit (INDEPENDENT_AMBULATORY_CARE_PROVIDER_SITE_OTHER): Payer: Medicare Other | Admitting: Family Medicine

## 2016-05-24 VITALS — BP 146/70 | HR 92

## 2016-05-24 DIAGNOSIS — S39012A Strain of muscle, fascia and tendon of lower back, initial encounter: Secondary | ICD-10-CM | POA: Insufficient documentation

## 2016-05-24 DIAGNOSIS — M5416 Radiculopathy, lumbar region: Secondary | ICD-10-CM | POA: Diagnosis not present

## 2016-05-24 DIAGNOSIS — R5381 Other malaise: Secondary | ICD-10-CM

## 2016-05-24 NOTE — Patient Instructions (Signed)
Thank you for coming in today. Take 2 regular strength tylenol every 6 hours for pain as needed.  Continue PT.  Recheck in 1 month.  Use a heating pad.  Come back or go to the emergency room if you notice new weakness new numbness problems walking or bowel or bladder problems.

## 2016-05-24 NOTE — Progress Notes (Signed)
Nicole Duffy is a 83 y.o. female who presents to [facility] today for lower back pain.  She has had lower back pain for months that has gradually worsened over the past few weeks. It sometimes radiates to the inside of her right leg when she moves around certain ways, although the back pain is more intense. In the past few weeks, she has started doing home PT, which she feels has improved her strength. Pain is worse with walking and improves with rest. No history of trauma or falls recently, although she has been diffusely weak from being in a wheelchair for the past 5 months. She has been taking alleve with some benefit. No new bladder or bowel incontinence. She had an MRI in 2015 for low back pain and right-sided pain (see below for results).   Past Medical History:  Diagnosis Date  . Aftercare following joint replacement surgery    L TKA 10/2012  . Anemia, pernicious   . Anxiety   . Anxiety   . Benign essential HTN   . Depressed   . Diastolic CHF ([place])   . GERD (gastroesophageal reflux disease)   . HLD (hyperlipidemia)   . Hypothyroidism   . Insulin resistance syndrome   . Osteoarthritis, knee    s/p L TKA 10/2012  . Osteoporosis   . PUD (peptic ulcer disease)    remote   Past Surgical History:  Procedure Laterality Date  . hysterectomy (other)    . L Total Knee Arthroplasty Left 10/2012  . TONSILLECTOMY    . TUBAL LIGATION     Social History  Substance Use Topics  . Smoking status: Never Smoker  . Smokeless tobacco: Not on file  . Alcohol use No     ROS:  As above   Medications: Current Outpatient Prescriptions  Medication Sig Dispense Refill  . amLODipine (NORVASC) 10 MG tablet Take 1 tablet (10 mg total) by mouth daily. *NO ADDITIONAL REFILLS. PLEASE CALL THE OFFICE TO SCHEDULE AN APPOINTMENT* 30 tablet 0  . b complex vitamins tablet Take 1 tablet by mouth daily.    . Cholecalciferol (VITAMIN D3) 1000 units CAPS  Take by mouth.    . hydrochlorothiazide (HYDRODIURIL) 25 MG tablet Take by mouth.    . levothyroxine (SYNTHROID, LEVOTHROID) 75 MCG tablet Take by mouth.    . loperamide (IMODIUM) 2 MG capsule Take by mouth.    Marland Kitchen LORazepam (ATIVAN) 0.5 MG tablet Take by mouth.    . metoprolol succinate (TOPROL-XL) 25 MG 24 hr tablet TAKE 1 TABLET BY MOUTH EVERY DAY NEED OFFICE VISIT BEFORE ANY MORE REFILLS 15 tablet 0  . mineral/vitamin supplement (MULTIGEN) 70 MG TABS tablet Take by mouth.    . pantoprazole (PROTONIX) 40 MG tablet TAKE ONE TABLET TWICE DAILY BEFORE A A MEAL 180 tablet 3  . QUEtiapine (SEROQUEL) 100 MG tablet Take by mouth.    . ranitidine (ZANTAC) 150 MG tablet Take by mouth.    . thyroid (ARMOUR THYROID) 60 MG tablet Take 1 tablet (60 mg total) by mouth daily before breakfast. *THIS IS THE LAST REFILL. PLEASE CALL OFFICE TO SCHEDULE AN APPOINTMENT* 30 tablet 0  . VIIBRYD 40 MG TABS TAKE 1 TABLET EVERY DAY 30 tablet 6   No current facility-administered medications for this visit.    Allergies  Allergen Reactions  . Augmentin [Amoxicillin-Pot Clavulanate] Other (See Comments)    Causes severe acid reflux  . Buspar [Buspirone] Other (See Comments)  Fatigue, foggy headed, insomnia  . Nortriptyline Other (See Comments)    Change in mental status  . Prednisone     REACTION: GI Upset  . Prozac [Fluoxetine Hcl] Other (See Comments)  . Quinine Derivatives Other (See Comments)  . Sulfa Antibiotics Other (See Comments)  . Sulfonamide Derivatives     REACTION: Hives, Rash  . Tramadol Other (See Comments)    hallucinatoin  . Tylenol [Acetaminophen] Other (See Comments)    Stomach pain  . Xanax [Alprazolam] Other (See Comments)    Hallucinations     Exam:  BP (!) 146/70   Pulse 92   SpO2 95%  General: Well Developed, well nourished, and in no acute distress.  Neuro/Psych: Alert and oriented x3, extra-ocular muscles intact, able to move all 4 extremities, sensation grossly  intact. Skin: Warm and dry, no rashes noted.  Respiratory: Not using accessory muscles, speaking in full sentences, trachea midline.  Cardiovascular: Pulses palpable, no extremity edema. Abdomen: Does not appear distended. MSK:  Lower back Right lumbar paraspinal muscles tender to palpation No tenderness to palpation over lumbar midline Pain with standing form a sitting position and ambulating Straight leg raise test negative bilaterally   MRI lumbar spine 10/24/13: IMPRESSION: L1-2: Stenosis of the right side of the canal because of osteophytes and possible chronic calcified disc material. Neural compression could occur on the right.  L2-3: Bilateral facet arthropathy. Mild spinal stenosis without definite focal neural compression.  L3-4: Advanced bilateral facet arthropathy with 3 mm of anterolisthesis. Severe multifactorial stenosis that could cause neural compression on either or both sides. This is probably worse on the right.  L4-5: Bilateral facet arthropathy with lateral recess narrowing but no definite neural compression.  L5-S1:  Bilateral facet arthropathy without compressive stenosis.  No results found for this or any previous visit (from the past 48 hour(s)). No results found.   Assessment and Plan: 83 y.o. female with lower back pain, likely due to paraspinal muscle strain in the setting of increased activity with PT. There is some intermittent radiation of pain to the right inner thigh that may be explained by a compressed nerve given severe L3-4 stenosis noted on previous MRI. She remains deconditioned from being in a wheelchair; focus on improving her strength and mobility. - Continue PT; incorporate back exercises. - Continue Alleve; add Tylenol and heating pad for pain control - Follow up 1 month   No orders of the defined types were placed in this encounter.   Discussed warning signs or symptoms. Please see discharge instructions. Patient expresses  understanding.

## 2016-05-27 ENCOUNTER — Telehealth: Payer: Self-pay | Admitting: *Deleted

## 2016-05-27 NOTE — Telephone Encounter (Signed)
[name] from [facility] called requesting verbal orders for the pt.  She is asking for nursing to continue to see her once a week for the next 4 weeks for medications & disease management.

## 2016-05-27 NOTE — Telephone Encounter (Signed)
Ok for extension.   Nicole Lecher, MD

## 2016-05-28 NOTE — Telephone Encounter (Signed)
Called and informed Nicole Duffy of recommendations.Nicole Duffy

## 2016-07-01 ENCOUNTER — Ambulatory Visit (INDEPENDENT_AMBULATORY_CARE_PROVIDER_SITE_OTHER): Payer: Medicare Other | Admitting: Family Medicine

## 2016-07-01 ENCOUNTER — Encounter: Payer: Self-pay | Admitting: Family Medicine

## 2016-07-01 VITALS — BP 123/66 | HR 105 | Temp 99.1°F

## 2016-07-01 DIAGNOSIS — L989 Disorder of the skin and subcutaneous tissue, unspecified: Secondary | ICD-10-CM | POA: Diagnosis not present

## 2016-07-01 DIAGNOSIS — R079 Chest pain, unspecified: Secondary | ICD-10-CM | POA: Diagnosis not present

## 2016-07-01 DIAGNOSIS — K219 Gastro-esophageal reflux disease without esophagitis: Secondary | ICD-10-CM

## 2016-07-01 NOTE — Progress Notes (Signed)
Nicole Duffy is a 83 y.o. female who presents to [facility] today for skin lesions and chest pain.  Skin lesions: She has noticed two small bumps in her right armpit for the past few weeks that have been itchy but not painful. She has also had a skin lesion on the underside of her left upper arm that was previously seen by Dr. Madilyn Fireman but has since changed in color and shape. It is not painful or itchy. No fevers.   Chest pain: She has had central, nonradiating, sharp chest pain for the past 4 months that comes on when she bends over and resolves when she sits back. The pain bothers her a few times a week and has gotten more intense recently. She also endorses increased shortness of breath over the past week. No increased leg swelling from baseline, palpitations, or new lightheadedness. She has a residual cough after recently being treated for pneumonia, but has otherwise improved. She was recently admitted to to the emergency department with dehydration, hypokalemia, and hypomagnesemia and has returned to baseline since starting electrolyte replacement therapy. EKG at the time was unremarkable for ACS.  She has not been doing physical therapy for the past few weeks because they stopped coming, and that was just reinstated today. While she was doing PT, they did leg exercises with her but never got her out of her wheelchair to walk.   Past Medical History:  Diagnosis Date  . Aftercare following joint replacement surgery    L TKA 10/2012  . Anemia, pernicious   . Anxiety   . Anxiety   . Benign essential HTN   . Depressed   . Diastolic CHF ([place])   . GERD (gastroesophageal reflux disease)   . HLD (hyperlipidemia)   . Hypothyroidism   . Insulin resistance syndrome   . Osteoarthritis, knee    s/p L TKA 10/2012  . Osteoporosis   . PUD (peptic ulcer disease)    remote    Past Surgical History:  Procedure Laterality Date  . hysterectomy (other)    . L Total Knee Arthroplasty Left 10/2012  . TONSILLECTOMY    . TUBAL LIGATION     Social History  Substance Use Topics  . Smoking status: Never Smoker  . Smokeless tobacco: Not on file  . Alcohol use No   family history includes Colon cancer in her other; Depression in her brother; Diabetes in her brother; Hyperlipidemia in her brother; Hypertension in her brother; Ovarian cancer in her other.  ROS as above:  Medications: Current Outpatient Prescriptions  Medication Sig Dispense Refill  . amLODipine (NORVASC) 10 MG tablet Take 1 tablet (10 mg total) by mouth daily. *NO ADDITIONAL REFILLS. PLEASE CALL THE OFFICE TO SCHEDULE AN APPOINTMENT* 30 tablet 0  . b complex vitamins tablet Take 1 tablet by mouth daily.    . Cholecalciferol (VITAMIN D3) 1000 units CAPS Take by mouth.    . hydrochlorothiazide (HYDRODIURIL) 25 MG tablet Take by mouth.    . levothyroxine (SYNTHROID, LEVOTHROID) 75 MCG tablet Take by mouth.    . loperamide (IMODIUM) 2 MG capsule Take by mouth.    Marland Kitchen LORazepam (ATIVAN) 0.5 MG tablet Take by mouth.    . metoprolol succinate (TOPROL-XL) 25 MG 24 hr tablet TAKE 1 TABLET BY MOUTH EVERY DAY NEED OFFICE VISIT BEFORE ANY MORE REFILLS 15 tablet 0  . mineral/vitamin supplement (MULTIGEN) 70 MG TABS tablet Take by mouth.    Marland Kitchen  pantoprazole (PROTONIX) 40 MG tablet TAKE ONE TABLET TWICE DAILY BEFORE A A MEAL 180 tablet 3  . QUEtiapine (SEROQUEL) 100 MG tablet Take by mouth.    . ranitidine (ZANTAC) 150 MG tablet Take by mouth.    . thyroid (ARMOUR THYROID) 60 MG tablet Take 1 tablet (60 mg total) by mouth daily before breakfast. *THIS IS THE LAST REFILL. PLEASE CALL OFFICE TO SCHEDULE AN APPOINTMENT* 30 tablet 0  . VIIBRYD 40 MG TABS TAKE 1 TABLET EVERY DAY 30 tablet 6   No current facility-administered medications for this visit.    Allergies  Allergen Reactions  . Augmentin [Amoxicillin-Pot  Clavulanate] Other (See Comments)    Causes severe acid reflux  . Buspar [Buspirone] Other (See Comments)    Fatigue, foggy headed, insomnia  . Nortriptyline Other (See Comments)    Change in mental status  . Prednisone     REACTION: GI Upset  . Prozac [Fluoxetine Hcl] Other (See Comments)  . Quinine Derivatives Other (See Comments)  . Sulfa Antibiotics Other (See Comments)  . Sulfonamide Derivatives     REACTION: Hives, Rash  . Tramadol Other (See Comments)    hallucinatoin  . Tylenol [Acetaminophen] Other (See Comments)    Stomach pain  . Xanax [Alprazolam] Other (See Comments)    Hallucinations    Health Maintenance Health Maintenance  Topic Date Due  . DEXA SCAN  10/05/1998  . PNA vac Low Risk Adult (2 of 2 - PPSV23) 03/09/2015  . TETANUS/TDAP  05/25/2021  . INFLUENZA VACCINE  Addressed  . ZOSTAVAX  Addressed     Exam:  BP 123/66   Pulse (!) 105   Temp 99.1 F (37.3 C)  Gen: Well NAD HEENT: EOMI,  MMM Lungs: Normal work of breathing. CTABL Heart: RRR no MRG Abd: NABS, Soft. Nondistended, Nontender Exts: Warm and well perfused. Bilateral edema to the knees, pitting over the ankles. Skin: Left axilla 2-3cm hyperpigmented stuck on lesion with skin tag. Right axilla two smaller (4mm) skin colored papules. Non-tender.    Shave Biopsy.  Shave biopsy: Consent obtained timeout performed. Skin cleaned with alcohol. Cold spray applied. 3 ml of Lidocaine injected achieving good anesthesia. Skin was again cleaned with alcohol. A deep shave biopsy was used to remove the skin lesion. A dressing was applied. Patient tolerated the procedure well.    EKG 06/04/16 (Care Everywhere) Diagnosis Sinus tachycardia with 1st degree AV block Right atrial enlargement Left axis deviation Low voltage QRS Cannot rule out Anteroseptal infarct (cited on or before 01-Nov-2012) Abnormal ECG When compared with ECG of 28-Jul-2015 11:22, fusion complexes are no longer  present premature ventricular complexes are no longer present  EKG 07/01/16: Normal sinus rhythm Possible left atrial enlargement Left axis deviation Low voltage QRS No ST segment elevation or depression.  Abnormal ECG  No results found for this or any previous visit (from the past 72 hour(s)). No results found.   Assessment and Plan: 83 y.o. female with:  Chest pain: Unlikely to be cardiac due to being position-dependent at rest, chronic nature, and reproduction with palpation of chest wall. EKG unchanged from previous. Likely GERD given patient history; also consider costochondritis. - Continue omeprazole for reflux  Seborreic keratoses: Right armpit with two 4 mm lesions. Left upper arm with 2 cm lesion with skin tag, removed today. - F/u shave biopsy results  Physical deconditioning: PT stopped for a few weeks and will resume this week. Goal is ambulation out of wheelchair. PT previously worked on leg  strengthening without ambulation. - Resume PT with focus on mobility - Wean lorezepam  - Bring pill bottles to next visit w/ Dr. Madilyn Fireman   No orders of the defined types were placed in this encounter.  No orders of the defined types were placed in this encounter.    Discussed warning signs or symptoms. Please see discharge instructions. Patient expresses understanding.

## 2016-07-01 NOTE — Patient Instructions (Signed)
Thank you for coming in today. Return with Dr Madilyn Fireman.  Make sure to bring all your pill bottles with you.   Continue the acid reflux medicines.   We will work to wean off the lorazepam.    Seborrheic Keratosis Seborrheic keratosis is a common, noncancerous (benign) skin growth. This condition causes waxy, rough, tan, brown, or black spots to appear on the skin. These skin growths can be flat or raised. What are the causes? The cause of this condition is not known. What increases the risk? This condition is more likely to develop in:  People who have a family history of seborrheic keratosis.  People who are 51 or older.  People who are pregnant.  People who have had estrogen replacement therapy. What are the signs or symptoms? This condition often occurs on the face, chest, shoulders, back, or other areas. These growths:  Are usually painless, but may become irritated and itchy.  Can be yellow, brown, black, or other colors.  Are slightly raised or have a flat surface.  Are sometimes rough or wart-like in texture.  Are often waxy on the surface.  Are round or oval-shaped.  Sometimes look like they are "stuck on."  Often occur in groups, but may occur as a single growth. How is this diagnosed? This condition is diagnosed with a medical history and physical exam. A sample of the growth may be tested (skin biopsy). You may need to see a skin specialist (dermatologist). How is this treated? Treatment is not usually needed for this condition, unless the growths are irritated or are often bleeding. You may also choose to have the growths removed if you do not like their appearance. Most commonly, these growths are treated with a procedure in which liquid nitrogen is applied to "freeze" off the growth (cryosurgery). They may also be burned off with electricity or cut off. Follow these instructions at home:  Watch your growth for any changes.  Keep all follow-up visits as  told by your health care provider. This is important.  Do not scratch or pick at the growth or growths. This can cause them to become irritated or infected. Contact a health care provider if:  You suddenly have many new growths.  Your growth bleeds, itches, or hurts.  Your growth suddenly becomes larger or changes color. This information is not intended to replace advice given to you by your health care provider. Make sure you discuss any questions you have with your health care provider. Document Released: 06/05/2010 Document Revised: 10/09/2015 Document Reviewed: 09/18/2014 Elsevier Interactive Patient Education  2017 [employer].

## 2016-07-02 NOTE — Addendum Note (Signed)
Addended by: Huel Cote on: 07/02/2016 02:04 PM   Modules accepted: Orders

## 2016-07-08 ENCOUNTER — Ambulatory Visit: Payer: Medicare Other | Admitting: Family Medicine

## 2016-07-09 ENCOUNTER — Other Ambulatory Visit: Payer: Self-pay | Admitting: Family Medicine

## 2016-07-12 ENCOUNTER — Ambulatory Visit: Payer: Medicare Other | Admitting: Family Medicine

## 2016-07-14 ENCOUNTER — Encounter: Payer: Self-pay | Admitting: Family Medicine

## 2016-07-14 ENCOUNTER — Telehealth: Payer: Self-pay | Admitting: Family Medicine

## 2016-07-14 ENCOUNTER — Ambulatory Visit (INDEPENDENT_AMBULATORY_CARE_PROVIDER_SITE_OTHER): Payer: Medicare Other | Admitting: Family Medicine

## 2016-07-14 VITALS — BP 130/82 | HR 90 | Ht 65.0 in | Wt 212.0 lb

## 2016-07-14 DIAGNOSIS — F418 Other specified anxiety disorders: Secondary | ICD-10-CM

## 2016-07-14 DIAGNOSIS — M1611 Unilateral primary osteoarthritis, right hip: Secondary | ICD-10-CM

## 2016-07-14 DIAGNOSIS — R7301 Impaired fasting glucose: Secondary | ICD-10-CM

## 2016-07-14 DIAGNOSIS — E876 Hypokalemia: Secondary | ICD-10-CM | POA: Diagnosis not present

## 2016-07-14 DIAGNOSIS — R110 Nausea: Secondary | ICD-10-CM

## 2016-07-14 DIAGNOSIS — M25511 Pain in right shoulder: Secondary | ICD-10-CM

## 2016-07-14 DIAGNOSIS — M1711 Unilateral primary osteoarthritis, right knee: Secondary | ICD-10-CM

## 2016-07-14 DIAGNOSIS — I10 Essential (primary) hypertension: Secondary | ICD-10-CM | POA: Diagnosis not present

## 2016-07-14 DIAGNOSIS — E8881 Metabolic syndrome: Secondary | ICD-10-CM

## 2016-07-14 DIAGNOSIS — G8929 Other chronic pain: Secondary | ICD-10-CM

## 2016-07-14 DIAGNOSIS — R5381 Other malaise: Secondary | ICD-10-CM

## 2016-07-14 DIAGNOSIS — M6281 Muscle weakness (generalized): Secondary | ICD-10-CM

## 2016-07-14 LAB — BASIC METABOLIC PANEL WITH GFR
BUN: 10 mg/dL (ref 7–25)
CHLORIDE: 94 mmol/L — AB (ref 98–110)
CO2: 22 mmol/L (ref 20–31)
CREATININE: 1.4 mg/dL — AB (ref 0.60–0.88)
Calcium: 9.5 mg/dL (ref 8.6–10.4)
GFR, Est African American: 40 mL/min — ABNORMAL LOW (ref >=60)
GFR, Est Non African American: 35 mL/min — ABNORMAL LOW (ref >=60)
GLUCOSE: 103 mg/dL — AB (ref 65–99)
Potassium: 4.4 mmol/L (ref 3.5–5.3)
SODIUM: 132 mmol/L — AB (ref 135–146)

## 2016-07-14 LAB — POCT GLYCOSYLATED HEMOGLOBIN (HGB A1C): Hemoglobin A1C: 5.7

## 2016-07-14 NOTE — Telephone Encounter (Signed)
FYI: Patient and daughter-in-law came in today and said that they have not had physical therapy come back out to the house. The nurse from home health come out a couple times but they have not seen the physical therapist. She said this was going to be extended through Dr. Georgina Snell.

## 2016-07-14 NOTE — Progress Notes (Signed)
Subjective:    CC: DM  HPI: 83 year old female is here to reestablish care today. Evidently when her son and daughter-in-law left town and she decided to have a a friend to take her to a new provider. Her daughter-in-law is here with her today and bring her back to reestablish care. She is very unhappy with some of the medication changes that were made and she is now also experiencing chronic nausea particularly in the mornings.  Impaired fasting glucose-she's doing well overall she's not sure when her last A1c was. She has noticed some increased thirst recently but she's also had some medication changes.  Hypertension- Pt denies chest pain, SOB, dizziness, or heart palpitations.  Taking meds as directed w/o problems.  Denies medication side effects.    Patient also was seen for upper respiratory illness in January the emergency department. At the time her electrolyte particularly, her potassium was low and they recommended that she have that rechecked at the following office visit. She actually feels like she is completely recovered from the acute illness.  Depression with anxiety-since I last saw her she has been put back on a benzodiazepine. We worked very hard and very long to wean her off of chronic benzos. She is now taking Ativan 0.5 mg twice a day. Her daughter-in-law's here with her today and they want to try to get off the medication. She's concerned that actually could be causing some of her nausea. She's been waking up nauseated every morning to the point that she actually doesn't want to eat breakfast. And it happens before she eats and before she actually takes her morning medications. She never actually vomits. She has a perception for Zofran which she takes once or twice a day. At some point she was also started on Seroquel and she is now on 50 mg twice a day.  Past medical history, Surgical history, Family history not pertinant except as noted below, Social history, Allergies, and  medications have been entered into the medical record, reviewed, and corrections made.   Review of Systems: No fevers, chills, night sweats, weight loss, chest pain, or shortness of breath.   Objective:    General: Well Developed, well nourished, and in no acute distress.  Neuro: Alert and oriented x3, extra-ocular muscles intact, sensation grossly intact.  HEENT: Normocephalic, atraumatic  Skin: Warm and dry, no rashes. Cardiac: Regular rate and rhythm, no murmurs rubs or gallops, no lower extremity edema.  Respiratory: Clear to auscultation bilaterally. Not using accessory muscles, speaking in full sentences.   Impression and Recommendations:   Impaired fasting glucose-Hemoccult A1c of 5.7 today which looks fantastic. Continue to monitor every 6 months.  Hypertension-well controlled. Continue current regimen. She's currently on 3 medications but I think right now her blood pressures are at goal and are not low.  Hypokalemia-due to recheck potassium level today from recent ED visit.  Depression with anxiety-currently on Seroquel 50 mg twice a day also on Ativan. Discussed a taper with the daughter-in-law which she wrote down. We'll start by decreasing the evening dose by half a tab for 5 days, then go down to half a tab twice a day for 5 days, then decrease down to half a tab once a day for 5 days, then half a tablet every other day for a week. If she needs to extend each transition. To 7 days for a little longer transition that's perfectly fine but will likely take [place] about 3 weeks to get her completely off the medication.  Then when I see her back in about 6-8 weeks we can address the Seroquel and whether or not we are going to continue it or not. Reminded her about the potential risk for dementia and falls with the Ativan.  Chronic morning nausea-we discussed meeting her pantoprazole to morning. Take about 30 minutes before first know the day and taking the ranitidine at bedtime. Also will  try weaning the Ativan and see if this helps. It could certainly be a medication side effect.  Time spent 45 minutes, greater than 50% time spent counseling about depression, medication management, nausea, hypokalemia, hypertension, and impaired fasting glucose.

## 2016-07-15 NOTE — Telephone Encounter (Signed)
Patient notified

## 2016-07-15 NOTE — Telephone Encounter (Signed)
We'll reorder home health physical therapy. Not quite sure what happened

## 2016-07-16 IMAGING — MR MR ABDOMEN WO/W CM
7 of 16 series · 20 of 48 positions shown · IV contrast (17ML MULTIHANCE)
Comparison: 04/03/2015

CLINICAL DATA: Abdominal pain and right lower back pain for 6
months, increasing in severity. Left renal lesion seen on
ultrasound.

EXAM:
MRI ABDOMEN WITHOUT AND WITH CONTRAST
TECHNIQUE: Multiplanar multisequence MR imaging of the abdomen was performed
both before and after the administration of intravenous contrast.
CONTRAST:  17mL MULTIHANCE GADOBENATE DIMEGLUMINE 529 MG/ML IV SOLN

[Series 3: T2 fat-sat · axial · 6.0mm · 1.48mm/px · z∈[-145,+72]mm · 3 of 30 slices shown]
[im 1/30]
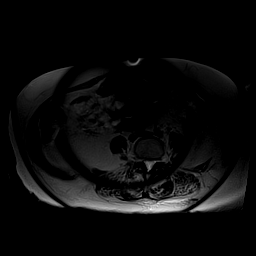
[im 15/30]
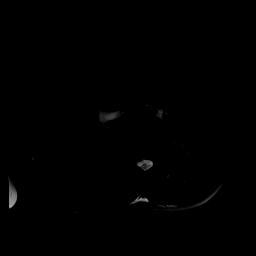
[im 30/30]
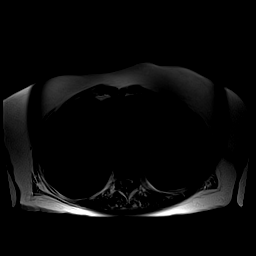

[Series 4: T2 · coronal · 7.0mm · 1.56mm/px · 2 of 32 slices shown (1 of 2)]
[im 1/32]
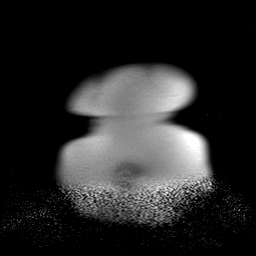
[im 32/32]
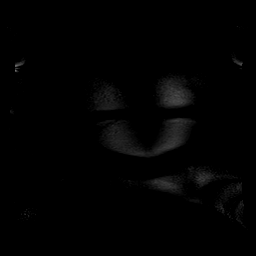

[Series 5: acr-in + out · axial · 6.0mm · 0.74mm/px · z∈[-191,+41]mm · 4 of 64 slices shown]
[im 1/64]
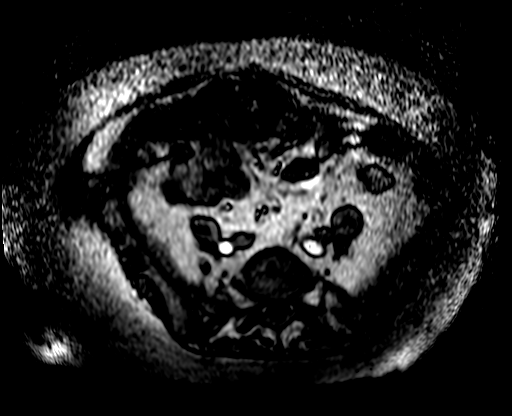
[im 22/64]
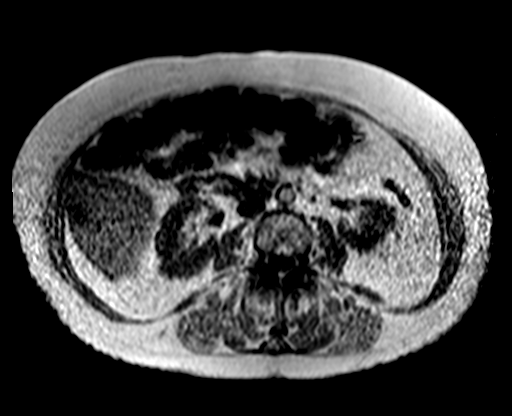
[im 43/64]
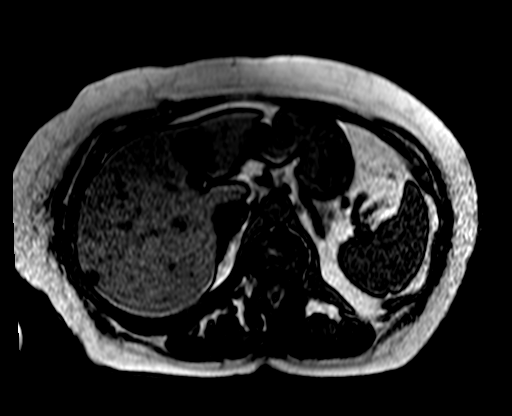
[im 64/64]
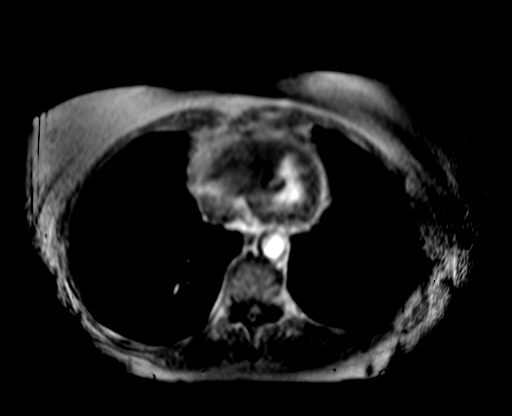

[Series 6: DWI · axial · 6.0mm · 0.74mm/px · z∈[-211,+66]mm · 2 of 38 slices shown]
[im 1/38]
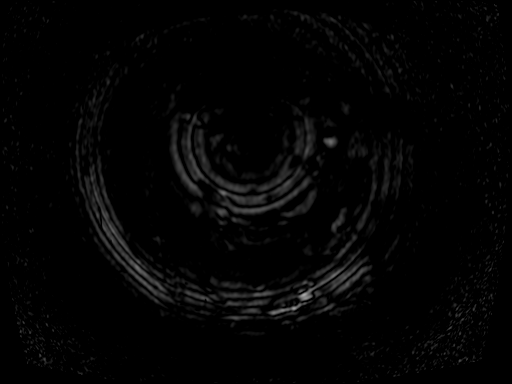
[im 38/38]
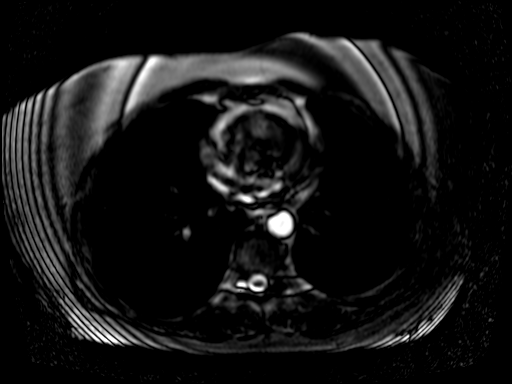

[Series 7: [id number] free breathing · axial · 6.0mm · 1.98mm/px · z∈[-159,+59]mm · 5 of 90 slices shown]
[im 1/90]
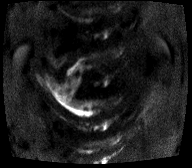
[im 23/90]
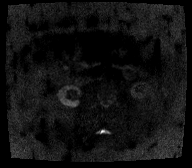
[im 45/90]
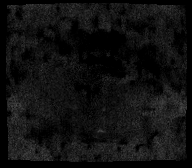
[im 67/90]
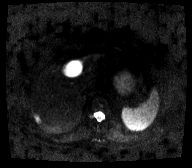
[im 90/90]
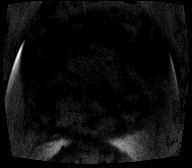

[Series 8: [id number] free breathing_adc · axial · 6.0mm · 1.98mm/px · z∈[-159,+59]mm · 2 of 30 slices shown]
[im 1/30]
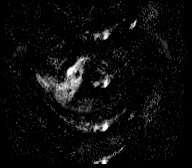
[im 30/30]
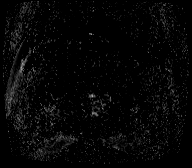

[Series 9: T2 · axial · 6.0mm · 1.48mm/px · z∈[-183,+34]mm · 2 of 30 slices shown (2 of 2)]
[im 1/30]
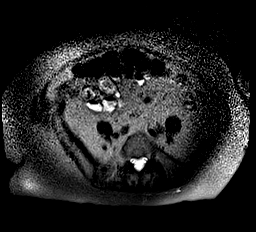
[im 30/30]
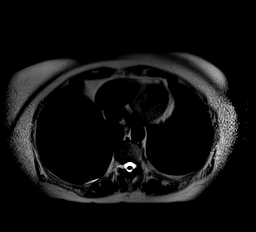

[20 of 48 positions shown; findings below may reference images not displayed]

FINDINGS: Lower chest:  Unremarkable

Hepatobiliary: Multiple simple hepatic cysts are present. The
largest is in the lateral segment left hepatic lobe and measures
by 3.1 cm on image 18 series 17. No abnormally enhancing hepatic
lesion is identified.

Pancreas: Numerous tiny cystic lesions are present in the pancreas
along with several larger cystic lesions. 1 of these larger lesions
in the tail the pancreas measures 1.3 by 1.0 cm on image 14 series
9. In the body of the pancreas, a tubular cystic lesion measures up
to 1.9 by 1.3 cm on image 15 series 9. Along the anterior margin of
the head of the pancreas, a 1.6 by 0.8 cm cystic lesion is present.
After contrast administration, none of these demonstrate significant
enhancement.

Spleen: Unremarkable

Adrenals/Urinary Tract: Adrenal glands normal. The small renal
lesion that prompted this study is highly indistinct both on the
recent ultrasound and also on today today's MRI. I believe it is
probably around image 23 of series 9, vaguely low signal on T2
weighted images, and potentially measuring up to 1 cm. The lesions
it is not visible on the diffusion weighted images[HOSPITAL] is obscured
by boundary artifact on a variety of sequences. On image 15 of
series 4 there is vague at convexity of the kidney in this area,
isointense to the kidney on these T2 weighted images. I do not see
any obvious enhancement of this assumes complex lesion on the
subtraction images but boundary artifact is affecting this area.

Stomach/Bowel: Unremarkable

Vascular/Lymphatic: Unremarkable

Other: No supplemental non-categorized findings.

Musculoskeletal: Right lateral recess impingement at L1-2 likely
from disc material. Multilevel degenerative disc disease and
potential central narrowing of the thecal sac at L2- 3 and L3-4.
IMPRESSION: 1. The tiny lesion in the left kidney lower pole is very poorly seen
today. I suspect that it has slightly reduced T2 intensity and
isointensity to the renal parenchyma on precontrast T1 weighted
images. Part of this is because the patient's body habitus and part
is because of very poor signal to noise ratio in this area on
imaging due to boundary artifact. Although this is probably a
complex cyst and I do not see definite enhancement, he would be
improvement to classify this unequivocally as benign based on the
poor visualization on this exam. I would suggest that this lesion be
followed up in 12 months time by CT or MRI along with the pancreatic
cystic lesions.
2. Three cystic lesions of the pancreas which do not demonstrate
enhancement. Based on current guidelines, followup cross-sectional
imaging by CT or MRI is recommended in 1 years time. This
recommendation follows ACR consensus guidelines: Managing Incidental
Findings on Abdominal CT: White Paper of the ACR Incidental Findings
Committee. [HOSPITAL] 0303;[DATE]
3. Multiple hepatic cysts are benign.
4. Lumbar impingement at L1- 2, L2- 3, and L3-4, not dissimilar to
the prior lumbar spine MRI of 10/24/2013.

## 2016-07-26 DIAGNOSIS — M1711 Unilateral primary osteoarthritis, right knee: Secondary | ICD-10-CM | POA: Diagnosis not present

## 2016-07-26 DIAGNOSIS — I129 Hypertensive chronic kidney disease with stage 1 through stage 4 chronic kidney disease, or unspecified chronic kidney disease: Secondary | ICD-10-CM | POA: Diagnosis not present

## 2016-07-26 DIAGNOSIS — N183 Chronic kidney disease, stage 3 (moderate): Secondary | ICD-10-CM | POA: Diagnosis not present

## 2016-07-26 DIAGNOSIS — K862 Cyst of pancreas: Secondary | ICD-10-CM | POA: Diagnosis not present

## 2016-07-29 ENCOUNTER — Ambulatory Visit (INDEPENDENT_AMBULATORY_CARE_PROVIDER_SITE_OTHER): Payer: Medicare Other | Admitting: Family Medicine

## 2016-07-29 ENCOUNTER — Encounter: Payer: Self-pay | Admitting: Family Medicine

## 2016-07-29 VITALS — BP 138/54 | HR 94 | Ht 65.0 in | Wt 193.0 lb

## 2016-07-29 DIAGNOSIS — N179 Acute kidney failure, unspecified: Secondary | ICD-10-CM

## 2016-07-29 DIAGNOSIS — M6281 Muscle weakness (generalized): Secondary | ICD-10-CM

## 2016-07-29 DIAGNOSIS — M7989 Other specified soft tissue disorders: Secondary | ICD-10-CM

## 2016-07-29 MED ORDER — FUROSEMIDE 20 MG PO TABS: 20.0000 mg | ORAL_TABLET | Freq: Every day | ORAL | 0 refills | Status: DC | PRN

## 2016-07-29 MED ORDER — AMBULATORY NON FORMULARY MEDICATION: 0 refills | Status: AC

## 2016-07-29 NOTE — Progress Notes (Signed)
Subjective:    Patient ID: Nicole Duffy, female    DOB: [birth year], 83 y.o.   MRN: [id number]  HPI She still complain a lot of weakness. She was last here we discussed may be getting physical therapy come out the home since she started getting some nursing care. They never contacted her to schedule a physical therapy component. She is mostly using a wheelchair and has not been able to use her walker again.  Anxiety-she's down to half a tab on her Ativan twice a day and so far doing really well with this.  She comes in today because she is complaining of localized swelling of both legs. It's been chronic and been going on for quite some time but she does feel like it's gradually getting a little worse. She is more swelling the right flank. The left side. She's noticed a few red spots over her lower legs that a been there for about a month. They have not gotten worse but they have not improved either. She says the swelling really doesn't get better in the mornings it's about the same all day.  Also here to follow-up on elevated renal function.   Review of Systems   BP (!) 138/54   Pulse 94   Ht 5\' 5"  (1.651 m)   Wt 193 lb (87.5 kg)   SpO2 96%   BMI 32.12 kg/m     Allergies  Allergen Reactions  . Augmentin [Amoxicillin-Pot Clavulanate] Other (See Comments)    Causes severe acid reflux  . Buspar [Buspirone] Other (See Comments)    Fatigue, foggy headed, insomnia  . Nortriptyline Other (See Comments)    Change in mental status  . Prednisone     REACTION: GI Upset  . Prozac [Fluoxetine Hcl] Other (See Comments)  . Quinine Derivatives Other (See Comments)  . Sulfa Antibiotics Other (See Comments)  . Sulfonamide Derivatives     REACTION: Hives, Rash  . Tramadol Other (See Comments)    hallucinatoin  . Tylenol [Acetaminophen] Other (See Comments)    Stomach pain  . Xanax [Alprazolam] Other (See Comments)    Hallucinations    Past Medical History:  Diagnosis Date  .  Aftercare following joint replacement surgery    L TKA 10/2012  . Anemia, pernicious   . Anxiety   . Anxiety   . Benign essential HTN   . Depressed   . Diastolic CHF ([place])   . GERD (gastroesophageal reflux disease)   . HLD (hyperlipidemia)   . Hypothyroidism   . Insulin resistance syndrome   . Osteoarthritis, knee    s/p L TKA 10/2012  . Osteoporosis   . PUD (peptic ulcer disease)    remote    Past Surgical History:  Procedure Laterality Date  . hysterectomy (other)    . L Total Knee Arthroplasty Left 10/2012  . TONSILLECTOMY    . TUBAL LIGATION      Social History   Social History  . Marital status: Divorced    Spouse name: N/A  . Number of children: N/A  . Years of education: N/A   Occupational History  . Not on file.   Social History Main Topics  . Smoking status: Never Smoker  . Smokeless tobacco: Never Used  . Alcohol use No  . Drug use: No  . Sexual activity: No   Other Topics Concern  . Not on file   Social History Narrative   Lives in assisted living center. Does not exercise.  Family History  Problem Relation Age of Onset  . Diabetes Brother   . Colon cancer Other     1st degree relative <60  . Ovarian cancer Other   . Hypertension Brother   . Hyperlipidemia Brother   . Depression Brother     Outpatient Encounter Prescriptions as of 07/29/2016  Medication Sig  . amLODipine (NORVASC) 10 MG tablet Take 1 tablet (10 mg total) by mouth daily. *NO ADDITIONAL REFILLS. PLEASE CALL THE OFFICE TO SCHEDULE AN APPOINTMENT*  . b complex vitamins tablet Take 1 tablet by mouth daily.  . Cholecalciferol (VITAMIN D3) 1000 units CAPS Take by mouth.  . hydrochlorothiazide (HYDRODIURIL) 25 MG tablet Take by mouth.  . levothyroxine (SYNTHROID, LEVOTHROID) 75 MCG tablet Take by mouth.  . loperamide (IMODIUM) 2 MG capsule Take by mouth.  Marland Kitchen LORazepam (ATIVAN) 0.5 MG tablet Take by mouth.  . metoprolol succinate (TOPROL-XL) 25 MG 24 hr tablet TAKE 1 TABLET BY  MOUTH EVERY DAY NEED OFFICE VISIT BEFORE ANY MORE REFILLS (Patient taking differently: TAKE 1 TABLET BY MOUTH EVERY DAY)  . mineral/vitamin supplement (MULTIGEN) 70 MG TABS tablet Take by mouth.  . ondansetron (ZOFRAN) 4 MG tablet Take 4 mg by mouth every 12 (twelve) hours as needed.   . pantoprazole (PROTONIX) 40 MG tablet TAKE ONE TABLET TWICE DAILY BEFORE A A MEAL (Patient taking differently: TAKE ONE TABLET ONCE DAILY BEFORE A A MEAL)  . QUEtiapine (SEROQUEL) 100 MG tablet Take 100 mg by mouth 2 (two) times daily.   . ranitidine (ZANTAC) 150 MG tablet Take 150 mg by mouth at bedtime.   Marland Kitchen VIIBRYD 40 MG TABS TAKE 1 TABLET EVERY DAY  . AMBULATORY NON FORMULARY MEDICATION Medication Name: Treatment for lymphedema  . furosemide (LASIX) 20 MG tablet Take 1 tablet (20 mg total) by mouth daily as needed.   No facility-administered encounter medications on file as of 07/29/2016.          Objective:   Physical Exam  Constitutional: She is oriented to person, place, and time. She appears well-developed and well-nourished.  HENT:  Head: Normocephalic and atraumatic.  Eyes: Conjunctivae and EOM are normal.  Cardiovascular: Normal rate.   Pulmonary/Chest: Effort normal.  Musculoskeletal:  She has trace pitting edema in her right ankle foot and up to the upper tibia just below the knee on the right. A little less edema on the left side. She has a slightly erythematous area on both anterior tibias The ankle. No open wounds.  Neurological: She is alert and oriented to person, place, and time.  Skin: Skin is dry. No pallor.  Psychiatric: She has a normal mood and affect. Her behavior is normal.  Vitals reviewed.       Assessment & Plan:  Increased creatinine-she artery at baseline has CKD 3 but she did have a bump in her creatinine side like to recheck that today. The nurse was coming from home health encouraged her to drop back on her fluid intake that she's only been drinking 36 ounces of  fluid a day. I encouraged her to increase that to 60 ounces.  Lower extremity swelling-likely etiology is lymphedema. Discussed treatment is actually massaging the fluid up and wearing compression stockings. We will start by seeing if her home health company would be able to provide this service they likely they will not. We can then check with her local hospital system here. She said she would not be able to stand to wear any type of compression stocking  on her legs. Discussed a short trial of furosemide for couple days just to see if she feels like the swelling improves. Encouraged her to try to the weekend and give me, me know if it's helpful.   Anxiety-down to half a tab of the lorazepam twice a day

## 2016-07-29 NOTE — Patient Instructions (Signed)
Try the furosemide ( fluid pill ) once a day for 2 days and see if you feel your swelling is better.

## 2016-07-30 LAB — BASIC METABOLIC PANEL WITH GFR
BUN: 9 mg/dL (ref 7–25)
CALCIUM: 9.3 mg/dL (ref 8.6–10.4)
CO2: 19 mmol/L — AB (ref 20–31)
Chloride: 93 mmol/L — ABNORMAL LOW (ref 98–110)
Creat: 1.43 mg/dL — ABNORMAL HIGH (ref 0.60–0.88)
GFR, Est African American: 39 mL/min — ABNORMAL LOW (ref >=60)
GFR, Est Non African American: 34 mL/min — ABNORMAL LOW (ref >=60)
GLUCOSE: 101 mg/dL — AB (ref 65–99)
Potassium: 4.3 mmol/L (ref 3.5–5.3)
Sodium: 128 mmol/L — ABNORMAL LOW (ref 135–146)

## 2016-08-02 ENCOUNTER — Telehealth: Payer: Self-pay | Admitting: Family Medicine

## 2016-08-02 NOTE — Telephone Encounter (Signed)
Agree with above 

## 2016-08-02 NOTE — Telephone Encounter (Signed)
Received call from [place] with [facility]. Request verbal order for PT 2x week for 4 weeks. Verbal given.

## 2016-08-06 ENCOUNTER — Telehealth: Payer: Self-pay

## 2016-08-06 DIAGNOSIS — R609 Edema, unspecified: Secondary | ICD-10-CM

## 2016-08-06 NOTE — Telephone Encounter (Signed)
Pt's son Ronalee Belts called and stated that patient has been off HCTZ for 5 days, and is still having swelling in feet and legs.  What do you recommend?  Please advise.

## 2016-08-06 NOTE — Telephone Encounter (Signed)
Did she try the furosemide pill. I have given her a few to try for 2 days and see if she felt it was helping.  Why is she off the HCTZ?

## 2016-08-09 NOTE — Telephone Encounter (Signed)
Left message for son to call back to answer questions.

## 2016-08-09 NOTE — Telephone Encounter (Signed)
Called and left message at [facility] to see if they do this treatment.

## 2016-08-09 NOTE — Telephone Encounter (Signed)
Needs tx for lymph edema.  This type of swelling doesn't respond well the diuretic.  IT requires therapeutic massage to move the swelling out of the legs.  Lets try to contact her home health and see if they offer tx for this or if she will need an outpatient referral to lymph edema clinic.

## 2016-08-09 NOTE — Telephone Encounter (Signed)
I spoke with her son Ronalee Belts, He said his wife who was at the appointment with Arbie Cookey understood you to say that she was to stop her HCTZ for 5 days and try the furosemide.  They stopped the HCTZ, and tried the other 2 meds which he is saying has not helped.  Please advise as to what they need to do now.

## 2016-08-10 NOTE — Telephone Encounter (Signed)
Referral placed.

## 2016-08-10 NOTE — Telephone Encounter (Signed)
Spoke with Nicole Duffy at [facility] this morning, they do not do this tx.  She will need a referral to a lymph edema clinic

## 2016-08-10 NOTE — Telephone Encounter (Signed)
OK, please place referral.  

## 2016-08-24 ENCOUNTER — Telehealth: Payer: Self-pay | Admitting: Family Medicine

## 2016-08-24 NOTE — Telephone Encounter (Signed)
Received call from [place] with [facility]. They would like a verbal for the following extension of home health PT:  1 visit a week for 1 week. 3 visits a week for 2 weeks. 1 visit a week for 1 week.  This would be a total of 4 additional weeks. Verbal given.

## 2016-08-24 NOTE — Telephone Encounter (Signed)
OK, thank you.

## 2016-09-06 ENCOUNTER — Other Ambulatory Visit: Payer: Self-pay | Admitting: Family Medicine

## 2016-09-13 ENCOUNTER — Ambulatory Visit (INDEPENDENT_AMBULATORY_CARE_PROVIDER_SITE_OTHER): Payer: Medicare Other | Admitting: Family Medicine

## 2016-09-13 ENCOUNTER — Telehealth: Payer: Self-pay | Admitting: Family Medicine

## 2016-09-13 ENCOUNTER — Encounter: Payer: Self-pay | Admitting: Family Medicine

## 2016-09-13 VITALS — BP 136/55 | HR 87

## 2016-09-13 DIAGNOSIS — M533 Sacrococcygeal disorders, not elsewhere classified: Secondary | ICD-10-CM

## 2016-09-13 DIAGNOSIS — R1013 Epigastric pain: Secondary | ICD-10-CM | POA: Diagnosis not present

## 2016-09-13 DIAGNOSIS — N281 Cyst of kidney, acquired: Secondary | ICD-10-CM

## 2016-09-13 DIAGNOSIS — I890 Lymphedema, not elsewhere classified: Secondary | ICD-10-CM | POA: Diagnosis not present

## 2016-09-13 DIAGNOSIS — F418 Other specified anxiety disorders: Secondary | ICD-10-CM

## 2016-09-13 DIAGNOSIS — K862 Cyst of pancreas: Secondary | ICD-10-CM

## 2016-09-13 DIAGNOSIS — G8929 Other chronic pain: Secondary | ICD-10-CM | POA: Diagnosis not present

## 2016-09-13 DIAGNOSIS — I10 Essential (primary) hypertension: Secondary | ICD-10-CM | POA: Diagnosis not present

## 2016-09-13 MED ORDER — FUROSEMIDE 20 MG PO TABS: 20.0000 mg | ORAL_TABLET | Freq: Every day | ORAL | 0 refills | Status: AC | PRN

## 2016-09-13 NOTE — Telephone Encounter (Signed)
Pt's son informed and would like to be contacted for scheduling. Name and # put on order.[name] [place]

## 2016-09-13 NOTE — Progress Notes (Signed)
Nicole Duffy is a 83 y.o. female who presents to [facility] today for back pain. Patient has pain in the right lower back. This is been present for a few weeks and is consistent with prior episodes of right SI joint pain. The patient did well with steroid injection and would like an injection of possible today. She denies any new radiating pain weakness or numbness fevers or chills. He has tried over-the-counter medications which have not helped much.   Past Medical History:  Diagnosis Date  . Aftercare following joint replacement surgery    L TKA 10/2012  . Anemia, pernicious   . Anxiety   . Anxiety   . Benign essential HTN   . Depressed   . Diastolic CHF ([place])   . GERD (gastroesophageal reflux disease)   . HLD (hyperlipidemia)   . Hypothyroidism   . Insulin resistance syndrome   . Osteoarthritis, knee    s/p L TKA 10/2012  . Osteoporosis   . PUD (peptic ulcer disease)    remote   Past Surgical History:  Procedure Laterality Date  . hysterectomy (other)    . L Total Knee Arthroplasty Left 10/2012  . TONSILLECTOMY    . TUBAL LIGATION     Social History  Substance Use Topics  . Smoking status: Never Smoker  . Smokeless tobacco: Never Used  . Alcohol use No     ROS:  As above   Medications: Current Outpatient Prescriptions  Medication Sig Dispense Refill  . AMBULATORY NON FORMULARY MEDICATION Medication Name: Treatment for lymphedema 1 vial 0  . amLODipine (NORVASC) 10 MG tablet Take 1 tablet (10 mg total) by mouth daily. *NO ADDITIONAL REFILLS. PLEASE CALL THE OFFICE TO SCHEDULE AN APPOINTMENT* 30 tablet 0  . b complex vitamins tablet Take 1 tablet by mouth daily.    . furosemide (LASIX) 20 MG tablet Take 1 tablet (20 mg total) by mouth daily as needed. 10 tablet 0  . hydrochlorothiazide (HYDRODIURIL) 25 MG tablet Take by mouth.    . levothyroxine (SYNTHROID, LEVOTHROID) 75 MCG tablet Take by mouth.    . loperamide  (IMODIUM) 2 MG capsule Take by mouth.    Marland Kitchen LORazepam (ATIVAN) 0.5 MG tablet Take by mouth.    . metoprolol succinate (TOPROL-XL) 25 MG 24 hr tablet TAKE 1 TABLET BY MOUTH EVERY DAY NEED OFFICE VISIT BEFORE ANY MORE REFILLS 15 tablet 0  . mineral/vitamin supplement (MULTIGEN) 70 MG TABS tablet Take by mouth.    . ondansetron (ZOFRAN) 4 MG tablet Take 4 mg by mouth every 12 (twelve) hours as needed.     . pantoprazole (PROTONIX) 40 MG tablet TAKE ONE TABLET TWICE DAILY BEFORE A A MEAL 180 tablet 3  . QUEtiapine (SEROQUEL) 100 MG tablet Take 100 mg by mouth 2 (two) times daily.     Marland Kitchen VIIBRYD 40 MG TABS TAKE 1 TABLET EVERY DAY 30 tablet 0  . Cholecalciferol (VITAMIN D3) 1000 units CAPS Take by mouth.     No current facility-administered medications for this visit.    Allergies  Allergen Reactions  . Augmentin [Amoxicillin-Pot Clavulanate] Other (See Comments)    Causes severe acid reflux  . Buspar [Buspirone] Other (See Comments)    Fatigue, foggy headed, insomnia  . Nortriptyline Other (See Comments)    Change in mental status  . Prednisone     REACTION: GI Upset  . Prozac [Fluoxetine Hcl] Other (See Comments)  . Quinine Derivatives Other (See Comments)  .  Sulfa Antibiotics Other (See Comments)  . Sulfonamide Derivatives     REACTION: Hives, Rash  . Tramadol Other (See Comments)    hallucinatoin  . Tylenol [Acetaminophen] Other (See Comments)    Stomach pain  . Xanax [Alprazolam] Other (See Comments)    Hallucinations     Exam:  BP (!) 136/55   Pulse 87  General: Well Developed, well nourished, and in no acute distress.  Neuro/Psych: Alert and oriented x3, extra-ocular muscles intact, able to move all 4 extremities, sensation grossly intact. Skin: Warm and dry, no rashes noted.  Respiratory: Not using accessory muscles, speaking in full sentences, trachea midline.  Cardiovascular: Pulses palpable, no extremity edema. Abdomen: Does not appear distended. MSK: Nontender to  spinal midline. Tender palpation right SI joint. Pain is worse with lower showing a weight bearing. Patient is able to transition from wheelchair to exam table by herself.  Procedure: Real-time Ultrasound Guided Injection of right SI joint  Device: GE Logiq E  Images permanently stored and available for review in the ultrasound unit. Verbal informed consent obtained. Discussed risks and benefits of procedure. Warned about infection bleeding damage to structures skin hypopigmentation and fat atrophy among others. Patient expresses understanding and agreement Time-out conducted.  Noted no overlying erythema, induration, or other signs of local infection.  Skin prepped in a sterile fashion.  Local anesthesia: Topical Ethyl chloride.  With sterile technique and under real time ultrasound guidance: 80mg  kenalog and 54ml marcaine injected easily.  Completed without difficulty  Pain immediately resolved suggesting accurate placement of the medication.  Advised to call if fevers/chills, erythema, induration, drainage, or persistent bleeding.  Images permanently stored and available for review in the ultrasound unit.  Impression: Technically successful ultrasound guided injection.      No results found for this or any previous visit (from the past 48 hour(s)). No results found.    Assessment and Plan: 83 y.o. female with SI joint pain and dysfunction improved status post injection. Patient displayed no lower extremity radiating numbness following the injection. Plan to return to clinic for recheck in one month. Continue weightbearing as tolerated.    No orders of the defined types were placed in this encounter.   Discussed warning signs or symptoms. Please see discharge instructions. Patient expresses understanding.

## 2016-09-13 NOTE — Telephone Encounter (Signed)
Call her son:  Upon review of her chart she is actually due for a follow-up MRI of the abdomen to follow-up on left renal cyst, 3 pancreatic cysts and a liver cyst. Will call her and see if we can get this scheduled.

## 2016-09-13 NOTE — Progress Notes (Signed)
Subjective:    Patient ID: Nicole Duffy, female    DOB: 1932/01/26, 83 y.o.   MRN: [id number]  HPI Hypertension- Pt denies chest pain, SOB, dizziness, or heart palpitations.  Taking meds as directed w/o problems.  Denies medication side effects.    Still having some LUQ/midepigastric area pain near the epigastric area. Worse when she bends, moves.  It is like her pain has been gradually bothering her more recently. Rates her pain an 8 out of 10 at times. She takes her PPI daily.  She was previously taking a supplement over-the-counter that was really helping her GI pain. But she hasn't been using it regularly.  Anxiety/depression - doing well on Viibryd.  Off the ATivan now.  This is great because we tried really hard to wean her off of Xanax several years ago. In fact to commence 2 years together completely down off the medication.  She also wants to discuss her ankle swelling further. She did go for consultation for lymphedema. They discussed that she would need to wear compression stockings on a regular basis and she said she is not interested in doing that. She did try the Lasix and felt like it really wasn't very helpful.  Review of Systems  BP (!) 136/55   Pulse 87     Allergies  Allergen Reactions  . Augmentin [Amoxicillin-Pot Clavulanate] Other (See Comments)    Causes severe acid reflux  . Buspar [Buspirone] Other (See Comments)    Fatigue, foggy headed, insomnia  . Nortriptyline Other (See Comments)    Change in mental status  . Prednisone     REACTION: GI Upset  . Prozac [Fluoxetine Hcl] Other (See Comments)  . Quinine Derivatives Other (See Comments)  . Sulfa Antibiotics Other (See Comments)  . Sulfonamide Derivatives     REACTION: Hives, Rash  . Tramadol Other (See Comments)    hallucinatoin  . Tylenol [Acetaminophen] Other (See Comments)    Stomach pain  . Xanax [Alprazolam] Other (See Comments)    Hallucinations    Past Medical History:  Diagnosis Date  .  Aftercare following joint replacement surgery    L TKA 10/2012  . Anemia, pernicious   . Anxiety   . Anxiety   . Benign essential HTN   . Depressed   . Diastolic CHF ([place])   . GERD (gastroesophageal reflux disease)   . HLD (hyperlipidemia)   . Hypothyroidism   . Insulin resistance syndrome   . Osteoarthritis, knee    s/p L TKA 10/2012  . Osteoporosis   . PUD (peptic ulcer disease)    remote    Past Surgical History:  Procedure Laterality Date  . hysterectomy (other)    . L Total Knee Arthroplasty Left 10/2012  . TONSILLECTOMY    . TUBAL LIGATION      Social History   Social History  . Marital status: Divorced    Spouse name: N/A  . Number of children: N/A  . Years of education: N/A   Occupational History  . Not on file.   Social History Main Topics  . Smoking status: Never Smoker  . Smokeless tobacco: Never Used  . Alcohol use No  . Drug use: No  . Sexual activity: No   Other Topics Concern  . Not on file   Social History Narrative   Lives in assisted living center. Does not exercise.     Family History  Problem Relation Age of Onset  . Diabetes Brother   . Colon  cancer Other     1st degree relative <60  . Ovarian cancer Other   . Hypertension Brother   . Hyperlipidemia Brother   . Depression Brother     Outpatient Encounter Prescriptions as of 09/13/2016  Medication Sig  . AMBULATORY NON FORMULARY MEDICATION Medication Name: Treatment for lymphedema  . amLODipine (NORVASC) 10 MG tablet Take 1 tablet (10 mg total) by mouth daily. *NO ADDITIONAL REFILLS. PLEASE CALL THE OFFICE TO SCHEDULE AN APPOINTMENT*  . b complex vitamins tablet Take 1 tablet by mouth daily.  . Cholecalciferol (VITAMIN D3) 1000 units CAPS Take by mouth.  . furosemide (LASIX) 20 MG tablet Take 1 tablet (20 mg total) by mouth daily as needed.  . hydrochlorothiazide (HYDRODIURIL) 25 MG tablet Take by mouth.  . levothyroxine (SYNTHROID, LEVOTHROID) 75 MCG tablet Take by mouth.  .  loperamide (IMODIUM) 2 MG capsule Take by mouth.  . metoprolol succinate (TOPROL-XL) 25 MG 24 hr tablet TAKE 1 TABLET BY MOUTH EVERY DAY NEED OFFICE VISIT BEFORE ANY MORE REFILLS  . mineral/vitamin supplement (MULTIGEN) 70 MG TABS tablet Take by mouth.  . ondansetron (ZOFRAN) 4 MG tablet Take 4 mg by mouth every 12 (twelve) hours as needed.   . pantoprazole (PROTONIX) 40 MG tablet TAKE ONE TABLET TWICE DAILY BEFORE A A MEAL  . potassium chloride (K-DUR) 10 MEQ tablet Take 20 mEq by mouth 2 (two) times daily.  . QUEtiapine (SEROQUEL) 100 MG tablet Take 100 mg by mouth 2 (two) times daily.   Marland Kitchen VIIBRYD 40 MG TABS TAKE 1 TABLET EVERY DAY  . [DISCONTINUED] furosemide (LASIX) 20 MG tablet Take 1 tablet (20 mg total) by mouth daily as needed.  . [DISCONTINUED] LORazepam (ATIVAN) 0.5 MG tablet Take by mouth.  . [DISCONTINUED] ranitidine (ZANTAC) 150 MG tablet Take 150 mg by mouth at bedtime.    No facility-administered encounter medications on file as of 09/13/2016.          Objective:   Physical Exam  Constitutional: She is oriented to person, place, and time. She appears well-developed and well-nourished.  HENT:  Head: Normocephalic and atraumatic.  Cardiovascular: Normal rate, regular rhythm and normal heart sounds.   Pulmonary/Chest: Effort normal and breath sounds normal.  Abdominal: Soft. Bowel sounds are normal. She exhibits no distension and no mass. There is tenderness. There is no rebound and no guarding.  + TTP over the epigastrum.   Neurological: She is alert and oriented to person, place, and time.  Skin: Skin is warm and dry.  Psychiatric: She has a normal mood and affect. Her behavior is normal.        Assessment & Plan:  HTN - Well controlled. Continue current regimen. Follow up in  3-4 months.    Epigastric Pain - Suspect this is related to hiatal hernia. She's had a on a PPI regularly. She wants to try taking her supplement again to see if that makes a difference. If not  then we could consider adding Carafate. I don't see any signs worrisome for pancreatitis or hepatitis on exam. No rebound or guarding.  Anxiety/depression-doing well on Viibryd. Completely off of benzodiazepines and doing well.  Lower extremity edema-most consistent with lymphedema. I do think she has some venous stasis that compounds or swelling by the end the day. I did go ahead and refill her Lasix to take as needed. If she finds she's been using it frequently then we may need to look at replacing her chlorothiazide with the furosemide.  Upon  review of her chart she is actually due for a follow-up MRI of the abdomen to follow-up on left renal cyst, 3 pancreatic cysts and a liver cyst. Will call her and see if we can get this scheduled.

## 2016-09-13 NOTE — Patient Instructions (Signed)
Thank you for coming in today. Recheck in 1 month.  Call or go to the ER if you develop a large red swollen joint with extreme pain or oozing puss.

## 2016-09-14 ENCOUNTER — Telehealth: Payer: Self-pay | Admitting: Family Medicine

## 2016-09-14 DIAGNOSIS — I10 Essential (primary) hypertension: Secondary | ICD-10-CM

## 2016-09-14 NOTE — Telephone Encounter (Signed)
Lab order placed.

## 2016-09-14 NOTE — Telephone Encounter (Signed)
-----   Message from Katha Hamming sent at 09/14/2016 11:04 AM EDT ----- Regarding: mri abd We have a order on Nicole Duffy for the MR abd w/wo.  Looks like her last labs were 07/29/16 and ran out last week.  They have to be within six weeks.  She will need another BUN/creatinine done before she has her MRI.  Thanks, Hoyle Sauer

## 2016-09-17 ENCOUNTER — Telehealth: Payer: Self-pay | Admitting: Family Medicine

## 2016-09-17 DIAGNOSIS — M1711 Unilateral primary osteoarthritis, right knee: Secondary | ICD-10-CM

## 2016-09-17 DIAGNOSIS — M25511 Pain in right shoulder: Secondary | ICD-10-CM

## 2016-09-17 DIAGNOSIS — M1611 Unilateral primary osteoarthritis, right hip: Secondary | ICD-10-CM

## 2016-09-17 DIAGNOSIS — M6281 Muscle weakness (generalized): Secondary | ICD-10-CM

## 2016-09-17 DIAGNOSIS — G8929 Other chronic pain: Secondary | ICD-10-CM

## 2016-09-17 NOTE — Telephone Encounter (Signed)
Call her home health and see if they can do an extension on her physical therapy. I think it's exposed in next week. But she is doing well and we are requesting an extension. Okay to give verbal order.

## 2016-09-20 MED ORDER — AMBULATORY NON FORMULARY MEDICATION: 0 refills | Status: AC

## 2016-09-20 NOTE — Telephone Encounter (Signed)
Order faxed.[name]

## 2016-09-20 NOTE — Telephone Encounter (Signed)
Transferred to [facility]: [phone]  Will send order to  [phone]

## 2016-10-01 ENCOUNTER — Other Ambulatory Visit: Payer: Self-pay | Admitting: Family Medicine

## 2016-10-25 DIAGNOSIS — M1711 Unilateral primary osteoarthritis, right knee: Secondary | ICD-10-CM | POA: Diagnosis not present

## 2016-10-25 DIAGNOSIS — I129 Hypertensive chronic kidney disease with stage 1 through stage 4 chronic kidney disease, or unspecified chronic kidney disease: Secondary | ICD-10-CM

## 2016-10-25 DIAGNOSIS — M1611 Unilateral primary osteoarthritis, right hip: Secondary | ICD-10-CM | POA: Diagnosis not present

## 2016-10-25 DIAGNOSIS — M6281 Muscle weakness (generalized): Secondary | ICD-10-CM

## 2016-10-26 ENCOUNTER — Other Ambulatory Visit: Payer: Self-pay | Admitting: Family Medicine

## 2016-10-28 ENCOUNTER — Inpatient Hospital Stay: Payer: Medicare Other | Admitting: Family Medicine

## 2016-12-27 ENCOUNTER — Ambulatory Visit: Payer: Medicare Other | Admitting: Family Medicine

## 2023-02-15 DEATH — deceased
